# Patient Record
Sex: Female | Born: 1975 | Race: White | Hispanic: No | Marital: Married | Smoking: Former smoker
Health system: Southern US, Community
[De-identification: ages and names within clinical notes are randomized; demographics above are authoritative.]

## PROBLEM LIST (undated history)

## (undated) DIAGNOSIS — F419 Anxiety disorder, unspecified: Secondary | ICD-10-CM

## (undated) DIAGNOSIS — L719 Rosacea, unspecified: Secondary | ICD-10-CM

## (undated) DIAGNOSIS — F32A Depression, unspecified: Secondary | ICD-10-CM

## (undated) DIAGNOSIS — N809 Endometriosis, unspecified: Secondary | ICD-10-CM

## (undated) DIAGNOSIS — B009 Herpesviral infection, unspecified: Secondary | ICD-10-CM

## (undated) DIAGNOSIS — T7840XA Allergy, unspecified, initial encounter: Secondary | ICD-10-CM

## (undated) DIAGNOSIS — J45909 Unspecified asthma, uncomplicated: Secondary | ICD-10-CM

## (undated) DIAGNOSIS — F329 Major depressive disorder, single episode, unspecified: Secondary | ICD-10-CM

## (undated) HISTORY — PX: DILATION AND CURETTAGE OF UTERUS: SHX78

## (undated) HISTORY — PX: LAPAROSCOPY: SHX197

## (undated) HISTORY — DX: Herpesviral infection, unspecified: B00.9

## (undated) HISTORY — DX: Depression, unspecified: F32.A

## (undated) HISTORY — DX: Major depressive disorder, single episode, unspecified: F32.9

## (undated) HISTORY — DX: Rosacea, unspecified: L71.9

## (undated) HISTORY — DX: Allergy, unspecified, initial encounter: T78.40XA

## (undated) HISTORY — DX: Unspecified asthma, uncomplicated: J45.909

## (undated) HISTORY — DX: Endometriosis, unspecified: N80.9

## (undated) HISTORY — DX: Anxiety disorder, unspecified: F41.9

---

## 2006-08-27 ENCOUNTER — Encounter: Payer: Self-pay | Admitting: Unknown Physician Specialty

## 2006-08-30 ENCOUNTER — Encounter: Payer: Self-pay | Admitting: Unknown Physician Specialty

## 2010-09-30 ENCOUNTER — Ambulatory Visit: Payer: Self-pay | Admitting: Internal Medicine

## 2010-10-18 ENCOUNTER — Ambulatory Visit: Payer: Self-pay | Admitting: Internal Medicine

## 2010-10-30 ENCOUNTER — Ambulatory Visit: Payer: Self-pay | Admitting: Internal Medicine

## 2010-11-30 ENCOUNTER — Ambulatory Visit: Payer: Self-pay | Admitting: Internal Medicine

## 2010-12-30 ENCOUNTER — Ambulatory Visit: Payer: Self-pay | Admitting: Internal Medicine

## 2011-02-28 ENCOUNTER — Ambulatory Visit: Payer: Self-pay | Admitting: General Practice

## 2011-07-03 ENCOUNTER — Ambulatory Visit: Payer: Self-pay | Admitting: General Practice

## 2011-07-03 LAB — HCG, QUANTITATIVE, PREGNANCY: Beta Hcg, Quant.: <1 m[IU]/mL — ABNORMAL LOW

## 2011-08-04 ENCOUNTER — Emergency Department: Payer: Self-pay | Admitting: Emergency Medicine

## 2013-07-21 ENCOUNTER — Ambulatory Visit: Payer: Self-pay | Admitting: General Practice

## 2015-05-20 ENCOUNTER — Other Ambulatory Visit: Payer: Self-pay | Admitting: Physician Assistant

## 2015-09-12 ENCOUNTER — Encounter: Payer: Self-pay | Admitting: Family Medicine

## 2015-09-13 ENCOUNTER — Other Ambulatory Visit: Payer: Self-pay | Admitting: Obstetrics and Gynecology

## 2015-09-13 DIAGNOSIS — Z1231 Encounter for screening mammogram for malignant neoplasm of breast: Secondary | ICD-10-CM

## 2015-10-30 ENCOUNTER — Ambulatory Visit
Admission: RE | Admit: 2015-10-30 | Discharge: 2015-10-30 | Disposition: A | Discharge status: Home / Self Care | Payer: BC Managed Care – PPO | Source: Ambulatory Visit | Attending: Obstetrics and Gynecology | Admitting: Obstetrics and Gynecology

## 2015-10-30 DIAGNOSIS — Z1231 Encounter for screening mammogram for malignant neoplasm of breast: Secondary | ICD-10-CM

## 2015-10-30 DIAGNOSIS — N63 Unspecified lump in breast: Secondary | ICD-10-CM | POA: Insufficient documentation

## 2015-11-01 ENCOUNTER — Other Ambulatory Visit: Payer: Self-pay | Admitting: Obstetrics and Gynecology

## 2015-11-01 DIAGNOSIS — R928 Other abnormal and inconclusive findings on diagnostic imaging of breast: Secondary | ICD-10-CM

## 2015-11-02 ENCOUNTER — Ambulatory Visit
Admission: RE | Admit: 2015-11-02 | Discharge: 2015-11-02 | Disposition: A | Discharge status: Home / Self Care | Payer: BC Managed Care – PPO | Source: Ambulatory Visit | Attending: Obstetrics and Gynecology | Admitting: Obstetrics and Gynecology

## 2015-11-02 DIAGNOSIS — R928 Other abnormal and inconclusive findings on diagnostic imaging of breast: Secondary | ICD-10-CM | POA: Insufficient documentation

## 2016-09-12 ENCOUNTER — Other Ambulatory Visit: Payer: Self-pay | Admitting: Obstetrics and Gynecology

## 2016-09-12 DIAGNOSIS — Z1231 Encounter for screening mammogram for malignant neoplasm of breast: Secondary | ICD-10-CM

## 2016-09-12 LAB — HM PAP SMEAR

## 2016-09-12 LAB — HEPATIC FUNCTION PANEL
ALT: 12 (ref 7–35)
AST: 13 (ref 13–35)
Alkaline Phosphatase: 39 (ref 25–125)
BILIRUBIN, TOTAL: 0.5

## 2016-09-12 LAB — BASIC METABOLIC PANEL
BUN: 11 (ref 4–21)
CREATININE: 0.6 (ref 0.5–1.1)
Glucose: 84
Potassium: 4.3 (ref 3.4–5.3)
SODIUM: 139 (ref 137–147)

## 2016-09-12 LAB — CBC AND DIFFERENTIAL
HCT: 40 (ref 36–46)
HEMOGLOBIN: 13.7 (ref 12.0–16.0)
PLATELETS: 286 (ref 150–399)
WBC: 4.4

## 2016-09-12 LAB — VITAMIN D 25 HYDROXY (VIT D DEFICIENCY, FRACTURES): Vit D, 25-Hydroxy: 15.1

## 2016-09-12 LAB — TSH: TSH: 0.95 (ref 0.41–5.90)

## 2016-09-12 LAB — LIPID PANEL
CHOLESTEROL: 159 (ref 0–200)
HDL: 50 (ref 35–70)
LDL CALC: 98
TRIGLYCERIDES: 74 (ref 40–160)

## 2016-09-12 LAB — HEMOGLOBIN A1C: HEMOGLOBIN A1C: 5.3

## 2017-01-09 ENCOUNTER — Ambulatory Visit
Admission: RE | Admit: 2017-01-09 | Discharge: 2017-01-09 | Disposition: A | Discharge status: Home / Self Care | Payer: BC Managed Care – PPO | Source: Ambulatory Visit | Attending: Obstetrics and Gynecology | Admitting: Obstetrics and Gynecology

## 2017-01-09 DIAGNOSIS — Z1231 Encounter for screening mammogram for malignant neoplasm of breast: Secondary | ICD-10-CM | POA: Diagnosis not present

## 2017-09-17 ENCOUNTER — Other Ambulatory Visit: Payer: Self-pay | Admitting: Obstetrics and Gynecology

## 2017-09-17 DIAGNOSIS — Z1231 Encounter for screening mammogram for malignant neoplasm of breast: Secondary | ICD-10-CM

## 2017-09-17 LAB — LIPID PANEL
Cholesterol: 182 (ref 0–200)
HDL: 61 (ref 35–70)
LDL Cholesterol: 115
Triglycerides: 75 (ref 40–160)

## 2017-09-17 LAB — CBC AND DIFFERENTIAL
HEMATOCRIT: 43 (ref 36–46)
HEMOGLOBIN: 14.4 (ref 12.0–16.0)
Platelets: 309 (ref 150–399)
WBC: 4.2

## 2017-09-17 LAB — VITAMIN D 25 HYDROXY (VIT D DEFICIENCY, FRACTURES): VIT D 25 HYDROXY: 22.8

## 2017-09-17 LAB — BASIC METABOLIC PANEL
BUN: 11 (ref 4–21)
CREATININE: 0.6 (ref 0.5–1.1)
Glucose: 89
POTASSIUM: 4 (ref 3.4–5.3)
SODIUM: 140 (ref 137–147)

## 2017-09-17 LAB — HEPATIC FUNCTION PANEL
ALT: 16 (ref 7–35)
AST: 15 (ref 13–35)
Alkaline Phosphatase: 45 (ref 25–125)
Bilirubin, Total: 0.7

## 2017-09-17 LAB — HEMOGLOBIN A1C: HEMOGLOBIN A1C: 5.2

## 2017-09-17 LAB — HM PAP SMEAR

## 2017-09-17 LAB — TSH: TSH: 1.29 (ref 0.41–5.90)

## 2018-01-12 ENCOUNTER — Ambulatory Visit
Admission: RE | Admit: 2018-01-12 | Discharge: 2018-01-12 | Disposition: A | Discharge status: Home / Self Care | Payer: BC Managed Care – PPO | Source: Ambulatory Visit | Attending: Obstetrics and Gynecology | Admitting: Obstetrics and Gynecology

## 2018-01-12 DIAGNOSIS — Z1231 Encounter for screening mammogram for malignant neoplasm of breast: Secondary | ICD-10-CM | POA: Insufficient documentation

## 2018-08-10 ENCOUNTER — Encounter: Payer: Self-pay | Admitting: Family Medicine

## 2018-08-10 ENCOUNTER — Ambulatory Visit: Payer: BC Managed Care – PPO | Admitting: Family Medicine

## 2018-08-10 VITALS — BP 121/83 | HR 76 | Temp 98.3°F | Ht 62.0 in | Wt 197.0 lb

## 2018-08-10 DIAGNOSIS — F17200 Nicotine dependence, unspecified, uncomplicated: Secondary | ICD-10-CM | POA: Diagnosis not present

## 2018-08-10 DIAGNOSIS — F3341 Major depressive disorder, recurrent, in partial remission: Secondary | ICD-10-CM | POA: Insufficient documentation

## 2018-08-10 DIAGNOSIS — Z6836 Body mass index (BMI) 36.0-36.9, adult: Secondary | ICD-10-CM

## 2018-08-10 DIAGNOSIS — L719 Rosacea, unspecified: Secondary | ICD-10-CM

## 2018-08-10 DIAGNOSIS — Z833 Family history of diabetes mellitus: Secondary | ICD-10-CM

## 2018-08-10 DIAGNOSIS — F411 Generalized anxiety disorder: Secondary | ICD-10-CM | POA: Diagnosis not present

## 2018-08-10 DIAGNOSIS — E669 Obesity, unspecified: Secondary | ICD-10-CM | POA: Insufficient documentation

## 2018-08-10 DIAGNOSIS — B009 Herpesviral infection, unspecified: Secondary | ICD-10-CM | POA: Insufficient documentation

## 2018-08-10 MED ORDER — BUPROPION HCL ER (XL) 300 MG PO TB24: 300.0000 mg | ORAL_TABLET | Freq: Every day | ORAL | 3 refills | Status: DC

## 2018-08-10 NOTE — Assessment & Plan Note (Signed)
Followed by Gordan Payment metrogel

## 2018-08-10 NOTE — Assessment & Plan Note (Signed)
Well controlled Contracted for safety Continue Bupropion at current dose Continue therapy

## 2018-08-10 NOTE — Progress Notes (Signed)
Patient: Alisha Miller, Female    DOB: Mar 26, 1976, 43 y.o.   MRN: [id number] Visit Date: 08/10/2018  Today's Provider: Lavon Paganini, MD   Chief Complaint  Patient presents with  . Establish Care   Subjective:    I, Tiburcio Pea, CMA, am acting as a scribe for Lavon Paganini, MD.    New Patient: Alisha Miller is a 43 y.o. female who presents today to Establish Care. She had her annual physical at [facility].  She will continue to be seen for GYN care there, but wants to have a PCP as she gets older. She feels well. She reports exercising none. She reports she is sleeping fairly well.  She has h/o genital herpes with rare outbreaks.  She does keep Valtrex on-hand in cadse of outbreak.  Husband has more outbreaks than her.  Anxiety/depression:  Ongoing intermittently for years.  Well controlled and stable on Wellbutrin XL 300mg  daily.  She is seeing a therapist once monthly.  Deneis AVH, SI, HI.  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  USed albuterol prn.  No problems in many years.  Never had PFTs.  Family history of breast cancer, cervical cancer: Followed by GYN.  Getting annual pap smears and mammograms.  Never had any abnormal.  Has IUD.  Also has h/o endometriosis.  Fam h/o DM: Blood sugars are good - fastings <100.  Last A1c 5.2.  Worried about her risk.  Has been smoking on and off for many years.  Currently smoking 1 pack of cigarettes per week. Will quit next month.  roseacea - metrogel daily Derm [facility] at [facility] -----------------------------------------------------------------   Review of Systems  Constitutional: Negative.   HENT: Negative.   Eyes: Negative.   Respiratory: Positive for chest tightness.   Cardiovascular: Negative.   Gastrointestinal: Negative.   Endocrine: Negative.   Genitourinary: Negative.   Musculoskeletal: Positive for back pain and neck pain.  Skin: Negative.     Allergic/Immunologic: Positive for environmental allergies.  Neurological: Negative.   Hematological: Negative.   Psychiatric/Behavioral: Negative.     Social History      She  reports that she has been smoking cigarettes. She has never used smokeless tobacco. She reports current alcohol use of about 13.0 standard drinks of alcohol per week. She reports that she does not use drugs.       Social History   Socioeconomic History  . Marital status: Married    Spouse name: Not on file  . Number of children: Not on file  . Years of education: Not on file  . Highest education level: Not on file  Occupational History  . Not on file  Social Needs  . Financial resource strain: Not on file  . Food insecurity:    Worry: Not on file    Inability: Not on file  . Transportation needs:    Medical: Not on file    Non-medical: Not on file  Tobacco Use  . Smoking status: Light Tobacco Smoker    Types: Cigarettes  . Smokeless tobacco: Never Used  Substance and Sexual Activity  . Alcohol use: Yes    Alcohol/week: 13.0 standard drinks    Types: 7 Glasses of wine, 6 Cans of beer per week  . Drug use: Never  . Sexual activity: Yes    Birth control/protection: I.U.D.  Lifestyle  . Physical activity:    Days per week: Not on file  Minutes per session: Not on file  . Stress: Not on file  Relationships  . Social connections:    Talks on phone: Not on file    Gets together: Not on file    Attends religious service: Not on file    Active member of club or organization: Not on file    Attends meetings of clubs or organizations: Not on file    Relationship status: Not on file  Other Topics Concern  . Not on file  Social History Narrative  . Not on file    Past Medical History:  Diagnosis Date  . Allergy   . Anxiety   . Asthma   . Depression   . Endometriosis   . HSV (herpes simplex virus) infection   . Rosacea      There are no active problems to display for this  patient.   Past Surgical History:  Procedure Laterality Date  . DILATION AND CURETTAGE OF UTERUS    . LAPAROSCOPY      Family History        Family Status  Relation Name Status  . [name]  (Not Specified)  . [name]  Deceased  . [name]  Deceased  . Mother  Deceased  . Father  Alive  . [name]  (Not Specified)  . [name]  Deceased  . [name]  Deceased        Her family history includes Alcohol abuse in her paternal grandfather; Alzheimer's disease in her mother; Arthritis in her mother; Atrial fibrillation in her father; Breast cancer in her maternal grandmother and paternal aunt; Cancer in her maternal grandmother, paternal aunt, and paternal grandmother; Cervical cancer in her paternal grandmother; Dementia in her maternal grandmother; Diabetes in her father, maternal grandfather, mother, and paternal aunt; Heart attack in her maternal grandfather and paternal grandfather; Heart disease in her father and paternal uncle; Heart failure in her mother; Hyperlipidemia in her father; Hypertension in her father, maternal grandfather, and paternal uncle; Prostate cancer in her father.      Allergies  Allergen Reactions  . Erythromycin Other (See Comments)     Current Outpatient Medications:  .  buPROPion (ZYBAN) 150 MG 12 hr tablet, Take 300 mg by mouth daily., Disp: , Rfl:  .  fluticasone (FLONASE) 50 MCG/ACT nasal spray, Place 2 sprays into both nostrils daily., Disp: , Rfl:  .  levonorgestrel (MIRENA) 20 MCG/24HR IUD, by Intrauterine route., Disp: , Rfl:  .  metroNIDAZOLE (METROGEL) 1 % gel, Apply topically daily., Disp: , Rfl:  .  SULFACLEANSE 8/4 8-4 % SUSP, , Disp: , Rfl:  .  valACYclovir (VALTREX) 500 MG tablet, , Disp: , Rfl:  .  Vitamin D, Ergocalciferol, (DRISDOL) 1.25 MG (50000 UT) CAPS capsule, , Disp: , Rfl:    Patient Care Team: Virginia Crews, MD as PCP - General (Family Medicine)    Objective:    Vitals: BP 121/83 (BP Location: Right Arm, Patient Position: Sitting,  Cuff Size: Normal)   Pulse 76   Temp 98.3 F (36.8 C) (Oral)   Ht 5\' 2"  (1.575 m)   Wt 197 lb (89.4 kg)   SpO2 99%   BMI 36.03 kg/m    Vitals:   08/10/18 0921  BP: 121/83  Pulse: 76  Temp: 98.3 F (36.8 C)  TempSrc: Oral  SpO2: 99%  Weight: 197 lb (89.4 kg)  Height: 5\' 2"  (1.575 m)     Physical Exam Vitals signs reviewed.  Constitutional:  General: She is not in acute distress.    Appearance: Normal appearance. She is well-developed. She is not diaphoretic.  HENT:     Head: Normocephalic and atraumatic.     Right Ear: Tympanic membrane, ear canal and external ear normal.     Left Ear: Tympanic membrane, ear canal and external ear normal.     Nose: Nose normal.     Mouth/Throat:     Mouth: Mucous membranes are moist.     Pharynx: Oropharynx is clear. No oropharyngeal exudate.  Eyes:     General: No scleral icterus.    Conjunctiva/sclera: Conjunctivae normal.     Pupils: Pupils are equal, round, and reactive to light.  Neck:     Musculoskeletal: Neck supple.     Thyroid: No thyromegaly.  Cardiovascular:     Rate and Rhythm: Normal rate and regular rhythm.     Pulses: Normal pulses.     Heart sounds: Normal heart sounds. No murmur.  Pulmonary:     Effort: Pulmonary effort is normal. No respiratory distress.     Breath sounds: Normal breath sounds. No wheezing or rales.  Abdominal:     General: Bowel sounds are normal. There is no distension.     Palpations: Abdomen is soft.     Tenderness: There is no abdominal tenderness. There is no guarding or rebound.  Musculoskeletal:        General: No deformity.     Right lower leg: No edema.     Left lower leg: No edema.  Lymphadenopathy:     Cervical: No cervical adenopathy.  Skin:    General: Skin is warm and dry.     Capillary Refill: Capillary refill takes less than 2 seconds.     Findings: No rash.  Neurological:     General: No focal deficit present.     Mental Status: She is alert and oriented to  person, place, and time. Mental status is at baseline.  Psychiatric:        Mood and Affect: Mood normal.        Behavior: Behavior normal.        Thought Content: Thought content normal.      Depression Screen PHQ 2/9 Scores 08/10/2018  PHQ - 2 Score 2  PHQ- 9 Score 11      Assessment & Plan:     Establish Care  Exercise Activities and Dietary recommendations Goals   None      There is no immunization history on file for this patient.  Health Maintenance  Topic Date Due  . HIV Screening  09/18/1990  . TETANUS/TDAP  09/18/1994  . PAP SMEAR-Modifier  09/17/1996  . INFLUENZA VACCINE  09/29/2018 (Originally 01/29/2018)     Discussed health benefits of physical activity, and encouraged her to engage in regular exercise appropriate for her age and condition.   ROI sent to [facility] for mammograms Reviewed last labs and pap smear result. --------------------------------------------------------------------  Problem List Items Addressed This Visit      Musculoskeletal and Integument   Rosacea    Followed by Payton Mccallum Continue metrogel        Other   Family history of diabetes mellitus    Will continue to monitor annual A1c Reviewed last A1c Discussed low carb diet and exercise      Recurrent major depressive disorder, in partial remission ([place]) - Primary    Well controlled Contracted for safety Continue Bupropion at current dose Continue therapy  Relevant Medications   buPROPion (WELLBUTRIN XL) 300 MG 24 hr tablet   GAD (generalized anxiety disorder)    Well controlled Continue Bupropion at current dose Continue therapy      Relevant Medications   buPROPion (WELLBUTRIN XL) 300 MG 24 hr tablet   Tobacco use disorder    Discussed improtance of cessation and health risks of continued smoking 3-5 minute discussion      HSV (herpes simplex virus) infection    Continue valtrex prn at first sign of outbreak      Relevant Medications    valACYclovir (VALTREX) 500 MG tablet   Obesity    Discussed importance of healthy weight management, diet, exercise          Return in about 6 months (around 02/08/2019) for CPE.  Approximately 45 minutes was spent in discussion of which greater than 50% was consultation.    The entirety of the information documented in the History of Present Illness, Review of Systems and Physical Exam were personally obtained by me. Portions of this information were initially documented by Tiburcio Pea, CMA and reviewed by me for thoroughness and accuracy.    Virginia Crews, MD, MPH [facility] 08/10/2018 11:59 AM

## 2018-08-10 NOTE — Assessment & Plan Note (Signed)
Continue valtrex prn at first sign of outbreak

## 2018-08-10 NOTE — Assessment & Plan Note (Signed)
Discussed importance of healthy weight management, diet, exercise

## 2018-08-10 NOTE — Assessment & Plan Note (Signed)
Discussed improtance of cessation and health risks of continued smoking 3-5 minute discussion

## 2018-08-10 NOTE — Patient Instructions (Signed)
Preventive Care 40-64 Years, Female Preventive care refers to lifestyle choices and visits with your health care provider that can promote health and wellness. What does preventive care include?   A yearly physical exam. This is also called an annual well check.  Dental exams once or twice a year.  Routine eye exams. Ask your health care provider how often you should have your eyes checked.  Personal lifestyle choices, including: ? Daily care of your teeth and gums. ? Regular physical activity. ? Eating a healthy diet. ? Avoiding tobacco and drug use. ? Limiting alcohol use. ? Practicing safe sex. ? Taking low-dose aspirin daily starting at age 50. ? Taking vitamin and mineral supplements as recommended by your health care provider. What happens during an annual well check? The services and screenings done by your health care provider during your annual well check will depend on your age, overall health, lifestyle risk factors, and family history of disease. Counseling Your health care provider may ask you questions about your:  Alcohol use.  Tobacco use.  Drug use.  Emotional well-being.  Home and relationship well-being.  Sexual activity.  Eating habits.  Work and work environment.  Method of birth control.  Menstrual cycle.  Pregnancy history. Screening You may have the following tests or measurements:  Height, weight, and BMI.  Blood pressure.  Lipid and cholesterol levels. These may be checked every 5 years, or more frequently if you are over 50 years old.  Skin check.  Lung cancer screening. You may have this screening every year starting at age 55 if you have a 30-pack-year history of smoking and currently smoke or have quit within the past 15 years.  Colorectal cancer screening. All adults should have this screening starting at age 50 and continuing until age 75. Your health care provider may recommend screening at age 45. You will have tests every  1-10 years, depending on your results and the type of screening test. People at increased risk should start screening at an earlier age. Screening tests may include: ? Guaiac-based fecal occult blood testing. ? Fecal immunochemical test (FIT). ? Stool DNA test. ? Virtual colonoscopy. ? Sigmoidoscopy. During this test, a flexible tube with a tiny camera (sigmoidoscope) is used to examine your rectum and lower colon. The sigmoidoscope is inserted through your anus into your rectum and lower colon. ? Colonoscopy. During this test, a long, thin, flexible tube with a tiny camera (colonoscope) is used to examine your entire colon and rectum.  Hepatitis C blood test.  Hepatitis B blood test.  Sexually transmitted disease (STD) testing.  Diabetes screening. This is done by checking your blood sugar (glucose) after you have not eaten for a while (fasting). You may have this done every 1-3 years.  Mammogram. This may be done every 1-2 years. Talk to your health care provider about when you should start having regular mammograms. This may depend on whether you have a family history of breast cancer.  BRCA-related cancer screening. This may be done if you have a family history of breast, ovarian, tubal, or peritoneal cancers.  Pelvic exam and Pap test. This may be done every 3 years starting at age 21. Starting at age 30, this may be done every 5 years if you have a Pap test in combination with an HPV test.  Bone density scan. This is done to screen for osteoporosis. You may have this scan if you are at high risk for osteoporosis. Discuss your test results, treatment options,   and if necessary, the need for more tests with your health care provider. Vaccines Your health care provider may recommend certain vaccines, such as:  Influenza vaccine. This is recommended every year.  Tetanus, diphtheria, and acellular pertussis (Tdap, Td) vaccine. You may need a Td booster every 10 years.  Varicella  vaccine. You may need this if you have not been vaccinated.  Zoster vaccine. You may need this after age 38.  Measles, mumps, and rubella (MMR) vaccine. You may need at least one dose of MMR if you were born in 1957 or later. You may also need a second dose.  Pneumococcal 13-valent conjugate (PCV13) vaccine. You may need this if you have certain conditions and were not previously vaccinated.  Pneumococcal polysaccharide (PPSV23) vaccine. You may need one or two doses if you smoke cigarettes or if you have certain conditions.  Meningococcal vaccine. You may need this if you have certain conditions.  Hepatitis A vaccine. You may need this if you have certain conditions or if you travel or work in places where you may be exposed to hepatitis A.  Hepatitis B vaccine. You may need this if you have certain conditions or if you travel or work in places where you may be exposed to hepatitis B.  Haemophilus influenzae type b (Hib) vaccine. You may need this if you have certain conditions. Talk to your health care provider about which screenings and vaccines you need and how often you need them. This information is not intended to replace advice given to you by your health care provider. Make sure you discuss any questions you have with your health care provider. Document Released: 07/14/2015 Document Revised: 08/07/2017 Document Reviewed: 04/18/2015 Elsevier Interactive Patient Education  2019 [employer].

## 2018-08-10 NOTE — Assessment & Plan Note (Signed)
Will continue to monitor annual A1c Reviewed last A1c Discussed low carb diet and exercise

## 2018-08-10 NOTE — Assessment & Plan Note (Signed)
Well controlled Continue Bupropion at current dose Continue therapy

## 2018-10-01 LAB — HEPATIC FUNCTION PANEL
ALT: 31 (ref 7–35)
AST: 22 (ref 13–35)
Alkaline Phosphatase: 54 (ref 25–125)
Bilirubin, Total: 0.3

## 2018-10-01 LAB — CBC AND DIFFERENTIAL
HCT: 39 (ref 36–46)
Hemoglobin: 12.8 (ref 12.0–16.0)
Platelets: 303 (ref 150–399)
WBC: 5.1

## 2018-10-01 LAB — HEMOGLOBIN A1C: Hemoglobin A1C: 5.2

## 2018-10-01 LAB — VITAMIN D 25 HYDROXY (VIT D DEFICIENCY, FRACTURES): Vit D, 25-Hydroxy: 51.6

## 2018-10-01 LAB — TSH: TSH: 1.27 (ref 0.41–5.90)

## 2018-10-01 LAB — BASIC METABOLIC PANEL
BUN: 10 (ref 4–21)
Creatinine: 0.8 (ref 0.5–1.1)
Glucose: 92
Potassium: 4.3 (ref 3.4–5.3)
Sodium: 139 (ref 137–147)

## 2018-10-02 LAB — LIPID PANEL
Cholesterol: 173 (ref 0–200)
HDL: 50 (ref 35–70)
LDL Cholesterol: 104
Triglycerides: 95 (ref 40–160)

## 2018-10-07 LAB — HM PAP SMEAR

## 2018-10-14 ENCOUNTER — Other Ambulatory Visit: Payer: Self-pay | Admitting: Obstetrics and Gynecology

## 2018-10-14 DIAGNOSIS — Z1231 Encounter for screening mammogram for malignant neoplasm of breast: Secondary | ICD-10-CM

## 2019-02-02 ENCOUNTER — Ambulatory Visit (INDEPENDENT_AMBULATORY_CARE_PROVIDER_SITE_OTHER): Payer: BC Managed Care – PPO | Admitting: Family Medicine

## 2019-02-02 ENCOUNTER — Other Ambulatory Visit: Payer: Self-pay

## 2019-02-02 ENCOUNTER — Encounter: Payer: Self-pay | Admitting: Family Medicine

## 2019-02-02 VITALS — BP 127/85 | HR 76 | Temp 98.7°F | Wt 195.4 lb

## 2019-02-02 DIAGNOSIS — Z6835 Body mass index (BMI) 35.0-35.9, adult: Secondary | ICD-10-CM

## 2019-02-02 DIAGNOSIS — E669 Obesity, unspecified: Secondary | ICD-10-CM

## 2019-02-02 DIAGNOSIS — F3341 Major depressive disorder, recurrent, in partial remission: Secondary | ICD-10-CM | POA: Diagnosis not present

## 2019-02-02 DIAGNOSIS — Z833 Family history of diabetes mellitus: Secondary | ICD-10-CM | POA: Diagnosis not present

## 2019-02-02 DIAGNOSIS — Z0000 Encounter for general adult medical examination without abnormal findings: Secondary | ICD-10-CM | POA: Diagnosis not present

## 2019-02-02 DIAGNOSIS — F411 Generalized anxiety disorder: Secondary | ICD-10-CM

## 2019-02-02 NOTE — Assessment & Plan Note (Signed)
Well-controlled Continue bupropion at current dose Continue therapy

## 2019-02-02 NOTE — Assessment & Plan Note (Signed)
Discussed importance of healthy weight management Discussed diet and exercise  

## 2019-02-02 NOTE — Patient Instructions (Signed)

## 2019-02-02 NOTE — Progress Notes (Signed)
Patient: Alisha Miller, Female    DOB: [birth year], 42 y.o.   MRN: [id number] Visit Date: 02/02/2019  Today's Provider: Lavon Paganini, MD   Chief Complaint  Patient presents with  . Annual Exam   Subjective:    I, Alisha Miller CMA, am acting as a scribe for Lavon Paganini, MD.    Annual physical exam Alisha Miller is a 43 y.o. female who presents today for health maintenance and complete physical. She feels well. She reports exercising none. She reports she is sleeping well.  ----------------------------------------------------------------- Last Pap:09/17/2017 - states that this was repeated in 2020 and was told that it was normal Last mammogram:01/12/2018 ( Pt has a pending order)  More stress at work recently.  Feels like she is struggling more with depression. Seems to happen each summer. Body image and poor self confidence contributes. Still seeing therapist and doing well onWellbutrin  Quit smoking in March.  Review of Systems  Constitutional: Negative.   HENT: Negative.   Eyes: Negative.   Respiratory: Negative.   Cardiovascular: Negative.   Gastrointestinal: Negative.   Endocrine: Negative.   Genitourinary: Negative.   Musculoskeletal: Positive for back pain, neck pain and neck stiffness.  Allergic/Immunologic: Positive for environmental allergies.  Neurological: Negative.   Hematological: Negative.   Psychiatric/Behavioral: Negative.     Social History      She  reports that she has been smoking cigarettes. She has never used smokeless tobacco. She reports current alcohol use of about 13.0 standard drinks of alcohol per week. She reports that she does not use drugs.       Social History   Socioeconomic History  . Marital status: Married    Spouse name: Not on file  . Number of children: 0  . Years of education: Not on file  . Highest education level: Not on file  Occupational History  . Occupation: [employer]   Social Needs  . Financial resource strain: Not on file  . Food insecurity    Worry: Not on file    Inability: Not on file  . Transportation needs    Medical: Not on file    Non-medical: Not on file  Tobacco Use  . Smoking status: Light Tobacco Smoker    Types: Cigarettes  . Smokeless tobacco: Never Used  Substance and Sexual Activity  . Alcohol use: Yes    Alcohol/week: 13.0 standard drinks    Types: 7 Glasses of wine, 6 Cans of beer per week  . Drug use: Never  . Sexual activity: Yes    Birth control/protection: I.U.D.  Lifestyle  . Physical activity    Days per week: Not on file    Minutes per session: Not on file  . Stress: Not on file  Relationships  . Social Herbalist on phone: Not on file    Gets together: Not on file    Attends religious service: Not on file    Active member of club or organization: Not on file    Attends meetings of clubs or organizations: Not on file    Relationship status: Not on file  Other Topics Concern  . Not on file  Social History Narrative  . Not on file    Past Medical History:  Diagnosis Date  . Allergy   . Anxiety   . Asthma   . Depression   . Endometriosis   . HSV (herpes simplex virus) infection   .  Rosacea      Patient Active Problem List   Diagnosis Date Noted  . Family history of diabetes mellitus 08/10/2018  . Recurrent major depressive disorder, in partial remission ([place]) 08/10/2018  . GAD (generalized anxiety disorder) 08/10/2018  . Rosacea 08/10/2018  . HSV (herpes simplex virus) infection 08/10/2018  . Obesity 08/10/2018    Past Surgical History:  Procedure Laterality Date  . DILATION AND CURETTAGE OF UTERUS    . LAPAROSCOPY      Family History        Family Status  Relation Name Status  . [name]  (Not Specified)  . [name]  Deceased  . [name]  Deceased  . Mother  Deceased  . Father  Alive  . [name]  (Not Specified)  . [name]  Deceased  . [name]  Deceased        Her family history  includes Alcohol abuse in her paternal grandfather; Alzheimer's disease in her mother; Arthritis in her mother; Atrial fibrillation in her father; Breast cancer in her maternal grandmother and paternal aunt; Cancer in her paternal aunt and paternal grandmother; Cervical cancer in her paternal grandmother; Dementia in her maternal grandmother; Diabetes in her father, maternal grandfather, mother, and paternal aunt; Heart attack in her maternal grandfather and paternal grandfather; Heart disease in her father and paternal uncle; Heart failure in her mother; Hyperlipidemia in her father; Hypertension in her father, maternal grandfather, and paternal uncle; Prostate cancer in her father.      Allergies  Allergen Reactions  . Erythromycin Other (See Comments)     Current Outpatient Medications:  .  buPROPion (WELLBUTRIN XL) 300 MG 24 hr tablet, Take 1 tablet (300 mg total) by mouth daily., Disp: 90 tablet, Rfl: 3 .  doxycycline (VIBRAMYCIN) 50 MG capsule, , Disp: , Rfl:  .  fluticasone (FLONASE) 50 MCG/ACT nasal spray, Place 2 sprays into both nostrils daily., Disp: , Rfl:  .  levonorgestrel (MIRENA) 20 MCG/24HR IUD, by Intrauterine route., Disp: , Rfl:  .  loratadine (CLARITIN) 10 MG tablet, Take 10 mg by mouth daily., Disp: , Rfl:  .  metroNIDAZOLE (METROGEL) 1 % gel, Apply topically daily., Disp: , Rfl:  .  SULFACLEANSE 8/4 8-4 % SUSP, , Disp: , Rfl:  .  valACYclovir (VALTREX) 500 MG tablet, , Disp: , Rfl:    Patient Care Team: Virginia Crews, MD as PCP - General (Family Medicine)    Objective:    Vitals: BP 127/85 (BP Location: Left Arm, Patient Position: Sitting, Cuff Size: Normal)   Pulse 76   Temp 98.7 F (37.1 C) (Oral)   Wt 195 lb 6.4 oz (88.6 kg)   SpO2 96%   BMI 35.74 kg/m    Vitals:   02/02/19 1002  BP: 127/85  Pulse: 76  Temp: 98.7 F (37.1 C)  TempSrc: Oral  SpO2: 96%  Weight: 195 lb 6.4 oz (88.6 kg)     Physical Exam Vitals signs reviewed.   Constitutional:      General: She is not in acute distress.    Appearance: Normal appearance. She is well-developed. She is not diaphoretic.  HENT:     Head: Normocephalic and atraumatic.     Right Ear: Tympanic membrane, ear canal and external ear normal.     Left Ear: Tympanic membrane, ear canal and external ear normal.  Eyes:     General: No scleral icterus.    Conjunctiva/sclera: Conjunctivae normal.     Pupils: Pupils are equal, round, and  reactive to light.  Neck:     Musculoskeletal: Neck supple.     Thyroid: No thyromegaly.  Cardiovascular:     Rate and Rhythm: Normal rate and regular rhythm.     Pulses: Normal pulses.     Heart sounds: Normal heart sounds. No murmur.  Pulmonary:     Effort: Pulmonary effort is normal. No respiratory distress.     Breath sounds: Normal breath sounds. No wheezing or rales.  Abdominal:     General: There is no distension.     Palpations: Abdomen is soft.     Tenderness: There is no abdominal tenderness.  Musculoskeletal:        General: No deformity.     Right lower leg: No edema.     Left lower leg: No edema.  Lymphadenopathy:     Cervical: No cervical adenopathy.  Skin:    General: Skin is warm and dry.     Capillary Refill: Capillary refill takes less than 2 seconds.     Findings: No rash.  Neurological:     Mental Status: She is alert and oriented to person, place, and time. Mental status is at baseline.  Psychiatric:        Mood and Affect: Mood normal.        Behavior: Behavior normal.        Thought Content: Thought content normal.      Depression Screen PHQ 2/9 Scores 02/02/2019 08/10/2018  PHQ - 2 Score 1 2  PHQ- 9 Score 4 11       Assessment & Plan:     Routine Health Maintenance and Physical Exam  Exercise Activities and Dietary recommendations Goals   None     Immunization History  Administered Date(s) Administered  . Td 03/22/1998  . Tdap 09/27/2010    Health Maintenance  Topic Date Due  . HIV  Screening  09/18/1990  . MAMMOGRAM  01/13/2019  . INFLUENZA VACCINE  01/30/2019  . PAP SMEAR-Modifier  09/17/2020  . TETANUS/TDAP  09/26/2020     Discussed health benefits of physical activity, and encouraged her to engage in regular exercise appropriate for her age and condition.    Congratulated patient on quitting smoking Reviewed labs from 09/2018 ROI sent to GYN for 2020 Pap smear Plan to recheck screening labs at physical next year Advised to get a flu shot in the fall --------------------------------------------------------------------  Problem List Items Addressed This Visit      Other   Family history of diabetes mellitus   Recurrent major depressive disorder, in partial remission ([place])    Well-controlled Contracted for safety Continue bupropion at current dose Continue therapy      GAD (generalized anxiety disorder)    Well-controlled Continue bupropion at current dose Continue therapy      Obesity    Discussed importance of healthy weight management Discussed diet and exercise        Other Visit Diagnoses    Encounter for annual physical exam    -  Primary       Return in about 1 year (around 02/02/2020) for CPE.   The entirety of the information documented in the History of Present Illness, Review of Systems and Physical Exam were personally obtained by me. Portions of this information were initially documented by [facility], CMA and reviewed by me for thoroughness and accuracy.    [place], Dionne Bucy, MD MPH [facility]

## 2019-02-02 NOTE — Assessment & Plan Note (Signed)
Well-controlled Contracted for safety Continue bupropion at current dose Continue therapy

## 2019-03-15 ENCOUNTER — Ambulatory Visit
Admission: RE | Admit: 2019-03-15 | Discharge: 2019-03-15 | Disposition: A | Discharge status: Home / Self Care | Payer: BC Managed Care – PPO | Source: Ambulatory Visit | Attending: Obstetrics and Gynecology | Admitting: Obstetrics and Gynecology

## 2019-03-15 ENCOUNTER — Other Ambulatory Visit: Payer: Self-pay

## 2019-03-15 ENCOUNTER — Encounter: Payer: Self-pay | Admitting: Family Medicine

## 2019-03-15 DIAGNOSIS — Z1231 Encounter for screening mammogram for malignant neoplasm of breast: Secondary | ICD-10-CM | POA: Insufficient documentation

## 2019-03-16 ENCOUNTER — Other Ambulatory Visit: Payer: Self-pay | Admitting: Obstetrics and Gynecology

## 2019-03-16 ENCOUNTER — Other Ambulatory Visit: Payer: Self-pay | Admitting: Family Medicine

## 2019-03-16 ENCOUNTER — Encounter: Payer: Self-pay | Admitting: Family Medicine

## 2019-03-16 DIAGNOSIS — Z1239 Encounter for other screening for malignant neoplasm of breast: Secondary | ICD-10-CM

## 2019-03-16 DIAGNOSIS — R2231 Localized swelling, mass and lump, right upper limb: Secondary | ICD-10-CM

## 2019-03-16 DIAGNOSIS — N6310 Unspecified lump in the right breast, unspecified quadrant: Secondary | ICD-10-CM

## 2019-03-23 ENCOUNTER — Ambulatory Visit
Admission: RE | Admit: 2019-03-23 | Discharge: 2019-03-23 | Disposition: A | Discharge status: Home / Self Care | Payer: BC Managed Care – PPO | Source: Ambulatory Visit | Attending: Family Medicine | Admitting: Family Medicine

## 2019-03-23 ENCOUNTER — Other Ambulatory Visit: Payer: Self-pay | Admitting: Family Medicine

## 2019-03-23 DIAGNOSIS — R2231 Localized swelling, mass and lump, right upper limb: Secondary | ICD-10-CM | POA: Diagnosis present

## 2019-03-23 DIAGNOSIS — N6310 Unspecified lump in the right breast, unspecified quadrant: Secondary | ICD-10-CM | POA: Diagnosis present

## 2019-03-23 DIAGNOSIS — Z1239 Encounter for other screening for malignant neoplasm of breast: Secondary | ICD-10-CM

## 2019-03-24 ENCOUNTER — Telehealth: Payer: Self-pay

## 2019-03-24 NOTE — Telephone Encounter (Signed)
LMTCB

## 2019-03-24 NOTE — Telephone Encounter (Signed)
-----   Message from Virginia Crews, MD sent at 03/24/2019  9:55 AM EDT ----- Normal mammogram and ultrasound. Repeat mammogram in 1 yr

## 2019-08-25 ENCOUNTER — Other Ambulatory Visit: Payer: Self-pay | Admitting: Family Medicine

## 2019-08-25 NOTE — Telephone Encounter (Signed)
Requested medication (s) are due for refill today:   Yes  Requested medication (s) are on the active medication list:   Yes  Future visit scheduled:   Yes   Last ordered: 08/10/2018  #90  3 refills  Clinic note:  Does not have a valid encounter within the last 6 months.   Requested Prescriptions  Pending Prescriptions Disp Refills   buPROPion (WELLBUTRIN XL) 300 MG 24 hr tablet [Pharmacy Med Name: BUPROPION HCL XL 300 MG TABLET] 90 tablet 3    Sig: TAKE 1 TABLET BY MOUTH EVERY DAY      Psychiatry: Antidepressants - bupropion Failed - 08/25/2019  8:17 AM      Failed - Valid encounter within last 6 months    Recent Outpatient Visits           6 months ago Encounter for annual physical exam   [facility], Dionne Bucy, MD   1 year ago Recurrent major depressive disorder, in partial remission [facility])   [facility], Dionne Bucy, MD              Passed - Completed PHQ-2 or PHQ-9 in the last 360 days.      Passed - Last BP in normal range    BP Readings from Last 1 Encounters:  02/02/19 127/85

## 2020-01-04 ENCOUNTER — Telehealth: Payer: Self-pay

## 2020-01-04 NOTE — Telephone Encounter (Signed)
Copied from [place] [id number]. Topic: General - Other >> Jan 04, 2020 10:43 AM Alanda Slim E wrote: Reason for CRM: Pt called to ask if her copay will be different since she will be seeing the PA for her cpe and not her pcp Dr. Jacinto Reap please call and advise

## 2020-01-31 NOTE — Progress Notes (Signed)
[name],acting as a Education administrator for [name], PA-C.,have documented all relevant documentation on the behalf of [name], PA-C,as directed by  [name], PA-C while in the presence of [name], PA-C.  Complete physical exam   Patient: Alisha Miller   DOB: 1975/09/13   44 y.o. Female  MRN: [id number] Visit Date: 02/02/2020  Today's healthcare provider: [name], PA-C   Chief Complaint  Patient presents with  . Annual Exam   Subjective    Alisha Miller is a 44 y.o. female who presents today for a complete physical exam.  She reports consuming a low carb diet. Home exercise routine includes walking and weight training. She generally feels well. She reports sleeping poorly. She does not have additional problems to discuss today.   HPI   Last pap: 10/07/2018 Last mammogram: 03/23/19  Patient reports she hasn't felt her IUD strings recently and would like them checked.   She continues with wellbutrin for depression/anxiety and feels this works well. Reports mood is well controlled.  Past Medical History:  Diagnosis Date  . Allergy   . Anxiety   . Asthma   . Depression   . Endometriosis   . HSV (herpes simplex virus) infection   . Rosacea    Past Surgical History:  Procedure Laterality Date  . DILATION AND CURETTAGE OF UTERUS    . LAPAROSCOPY     Social History   Socioeconomic History  . Marital status: Married    Spouse name: Not on file  . Number of children: 0  . Years of education: Not on file  . Highest education level: Not on file  Occupational History  . Occupation: [employer]  Tobacco Use  . Smoking status: Former Smoker    Types: Cigarettes    Quit date: 08/30/2018    Years since quitting: 1.4  . Smokeless tobacco: Never Used  Vaping Use  . Vaping Use: Never used  Substance and Sexual Activity  . Alcohol use: Yes    Alcohol/week: 13.0 standard drinks    Types: 7 Glasses of  wine, 6 Cans of beer per week  . Drug use: Never  . Sexual activity: Yes    Partners: Male    Birth control/protection: I.U.D.  Other Topics Concern  . Not on file  Social History Narrative  . Not on file   Social Determinants of Health   Financial Resource Strain:   . Difficulty of Paying Living Expenses:   Food Insecurity:   . Worried About Charity fundraiser in the Last Year:   . Arboriculturist in the Last Year:   Transportation Needs:   . Film/video editor (Medical):   Marland Kitchen Lack of Transportation (Non-Medical):   Physical Activity:   . Days of Exercise per Week:   . Minutes of Exercise per Session:   Stress:   . Feeling of Stress :   Social Connections:   . Frequency of Communication with Friends and Family:   . Frequency of Social Gatherings with Friends and Family:   . Attends Religious Services:   . Active Member of Clubs or Organizations:   . Attends Archivist Meetings:   Marland Kitchen Marital Status:   Intimate Partner Violence:   . Fear of Current or Ex-Partner:   . Emotionally Abused:   Marland Kitchen Physically Abused:   . Sexually Abused:    Family Status  Relation Name Status  .  [name]  (Not Specified)  . [name]  Deceased  . [name]  Deceased  . Mother  Deceased  . Father  Alive  . [name]  (Not Specified)  . [name]  Deceased  . [name]  Deceased   Family History  Problem Relation Age of Onset  . Breast cancer Paternal Aunt   . Cancer Paternal Aunt   . Diabetes Paternal Aunt   . Breast cancer Maternal Grandmother   . Dementia Maternal Grandmother   . Cervical cancer Paternal Grandmother   . Cancer Paternal Grandmother   . Alzheimer's disease Mother   . Diabetes Mother   . Heart failure Mother   . Arthritis Mother   . Diabetes Father   . Hypertension Father   . Hyperlipidemia Father   . Atrial fibrillation Father   . Heart disease Father   . Prostate cancer Father   . Heart disease Paternal Uncle   . Hypertension Paternal Uncle   . Heart attack Maternal  Grandfather   . Diabetes Maternal Grandfather   . Hypertension Maternal Grandfather   . Heart attack Paternal Grandfather   . Alcohol abuse Paternal Grandfather    Allergies  Allergen Reactions  . Erythromycin Other (See Comments)    Patient Care Team: Virginia Crews, MD as PCP - General (Family Medicine)   Medications: Outpatient Medications Prior to Visit  Medication Sig  . buPROPion (WELLBUTRIN XL) 300 MG 24 hr tablet TAKE 1 TABLET BY MOUTH EVERY DAY  . doxycycline (VIBRAMYCIN) 50 MG capsule   . fluticasone (FLONASE) 50 MCG/ACT nasal spray Place 2 sprays into both nostrils daily.  Marland Kitchen levonorgestrel (MIRENA) 20 MCG/24HR IUD by Intrauterine route.  . loratadine (CLARITIN) 10 MG tablet Take 10 mg by mouth daily.  . SULFACLEANSE 8/4 8-4 % SUSP   . valACYclovir (VALTREX) 500 MG tablet   . metroNIDAZOLE (METROGEL) 1 % gel Apply topically daily.   No facility-administered medications prior to visit.    Review of Systems  Constitutional: Positive for activity change.  HENT: Negative.   Eyes: Positive for itching.  Respiratory: Negative.   Cardiovascular: Negative.   Gastrointestinal: Negative.   Endocrine: Negative.   Genitourinary: Negative.   Musculoskeletal: Negative.   Skin: Negative.   Allergic/Immunologic: Negative.   Neurological: Negative.   Hematological: Negative.   Psychiatric/Behavioral: Negative.       Objective    BP 117/83 (BP Location: Right Arm, Patient Position: Sitting, Cuff Size: Normal)   Pulse 93   Temp 98.2 F (36.8 C) (Temporal)   Wt 169 lb 3.2 oz (76.7 kg)   BMI 30.95 kg/m    Physical Exam Vitals reviewed.  Constitutional:      Appearance: Normal appearance.  HENT:     Head: Normocephalic and atraumatic.     Right Ear: Tympanic membrane normal.     Left Ear: Tympanic membrane normal.     Mouth/Throat:     Mouth: Mucous membranes are moist.     Pharynx: Oropharynx is clear.  Eyes:     General: No scleral icterus.     Conjunctiva/sclera: Conjunctivae normal.  Neck:     Vascular: No carotid bruit.  Cardiovascular:     Rate and Rhythm: Normal rate and regular rhythm.     Pulses: Normal pulses.     Heart sounds: Normal heart sounds.  Pulmonary:     Effort: Pulmonary effort is normal.     Breath sounds: Normal breath sounds.  Abdominal:     General: Bowel sounds  are normal.     Palpations: Abdomen is soft.  Genitourinary:    Comments: IUD strings not visualized and not able to be swept from cervical os. Musculoskeletal:     Cervical back: Normal range of motion and neck supple.     Right lower leg: No edema.     Left lower leg: No edema.  Skin:    General: Skin is warm and dry.  Neurological:     General: No focal deficit present.     Mental Status: She is alert and oriented to person, place, and time.  Psychiatric:        Mood and Affect: Mood normal.        Behavior: Behavior normal.        Thought Content: Thought content normal.        Judgment: Judgment normal.       Last depression screening scores PHQ 2/9 Scores 02/02/2019 08/10/2018  PHQ - 2 Score 1 2  PHQ- 9 Score 4 11   Last fall risk screening Fall Risk  02/02/2019  Falls in the past year? 1  Number falls in past yr: 1  Injury with Fall? 1   Last Audit-C alcohol use screening Alcohol Use Disorder Test (AUDIT) 02/02/2019  1. How often do you have a drink containing alcohol? 3  2. How many drinks containing alcohol do you have on a typical day when you are drinking? 0  3. How often do you have six or more drinks on one occasion? 2  AUDIT-C Score 5   A score of 3 or more in women, and 4 or more in men indicates increased risk for alcohol abuse, EXCEPT if all of the points are from question 1   No results found for any visits on 02/02/20.  Assessment & Plan    Routine Health Maintenance and Physical Exam  Exercise Activities and Dietary recommendations Goals   None     Immunization History  Administered Date(s)  Administered  . Td 03/22/1998  . Tdap 09/27/2010    Health Maintenance  Topic Date Due  . Hepatitis C Screening  Never done  . COVID-19 Vaccine (1) Never done  . HIV Screening  Never done  . INFLUENZA VACCINE  01/30/2020  . MAMMOGRAM  03/22/2020  . TETANUS/TDAP  09/26/2020  . PAP SMEAR-Modifier  10/06/2021    Discussed health benefits of physical activity, and encouraged her to engage in regular exercise appropriate for her age and condition.  1. Encounter for annual physical exam  - CBC with Differential/Platelet - Comprehensive metabolic panel - TSH - Lipid panel  2. Class 2 obesity without serious comorbidity with body mass index (BMI) of 35.0 to 35.9 in adult, unspecified obesity type  - CBC with Differential/Platelet - Comprehensive metabolic panel - TSH - Lipid panel  3. GAD (generalized anxiety disorder)  - CBC with Differential/Platelet - Comprehensive metabolic panel - TSH - Lipid panel - buPROPion (WELLBUTRIN XL) 300 MG 24 hr tablet; Take 1 tablet (300 mg total) by mouth daily.  Dispense: 90 tablet; Refill: 3  4. Screening for cervical cancer  - Cytology - PAP  5. Family history of diabetes mellitus  - HgB A1c  6. Encounter for pregnancy test, result unknown  - POCT urine pregnancy  7. Intrauterine contraceptive device threads lost, initial encounter  US shows IUD in place. Counseled patient on expectant management of this. She may keep the device until which time she would like it removed, would need to visit  OBGYN. 2 - US Pelvic Complete With Transvaginal  8. Encounter for screening mammogram for malignant neoplasm of breast  - MM DIGITAL SCREENING BILATERAL   No follow-ups on file.     ITrinna Post, PA-C, have reviewed all documentation for this visit. The documentation on 02/29/20 for the exam, diagnosis, procedures, and orders are all accurate and complete.  The entirety of the information documented in the History of Present  Illness, Review of Systems and Physical Exam were personally obtained by me. Portions of this information were initially documented by Elonda Husky, CMA and reviewed by me for thoroughness and accuracy.   I spent 30 minutes dedicated to the care of this patient on the date of this encounter to include pre-visit review of records, face-to-face time with the patient discussing separate issues from CPE including anxiety and lost IUD strings, and post visit ordering of testing.    Paulene Floor  [facility] [phone] (phone) [phone] (fax)  [place]

## 2020-02-02 ENCOUNTER — Encounter: Payer: BC Managed Care – PPO | Admitting: Family Medicine

## 2020-02-02 ENCOUNTER — Ambulatory Visit (INDEPENDENT_AMBULATORY_CARE_PROVIDER_SITE_OTHER): Payer: BC Managed Care – PPO | Admitting: Physician Assistant

## 2020-02-02 ENCOUNTER — Encounter: Payer: Self-pay | Admitting: Physician Assistant

## 2020-02-02 ENCOUNTER — Other Ambulatory Visit: Payer: Self-pay

## 2020-02-02 ENCOUNTER — Other Ambulatory Visit (HOSPITAL_COMMUNITY)
Admission: RE | Admit: 2020-02-02 | Discharge: 2020-02-02 | Disposition: A | Discharge status: Home / Self Care | Payer: BC Managed Care – PPO | Source: Ambulatory Visit | Attending: Family Medicine | Admitting: Family Medicine

## 2020-02-02 VITALS — BP 117/83 | HR 93 | Temp 98.2°F | Wt 169.2 lb

## 2020-02-02 DIAGNOSIS — E669 Obesity, unspecified: Secondary | ICD-10-CM

## 2020-02-02 DIAGNOSIS — Z1231 Encounter for screening mammogram for malignant neoplasm of breast: Secondary | ICD-10-CM

## 2020-02-02 DIAGNOSIS — T8332XA Displacement of intrauterine contraceptive device, initial encounter: Secondary | ICD-10-CM

## 2020-02-02 DIAGNOSIS — Z6835 Body mass index (BMI) 35.0-35.9, adult: Secondary | ICD-10-CM

## 2020-02-02 DIAGNOSIS — Z124 Encounter for screening for malignant neoplasm of cervix: Secondary | ICD-10-CM | POA: Insufficient documentation

## 2020-02-02 DIAGNOSIS — F411 Generalized anxiety disorder: Secondary | ICD-10-CM

## 2020-02-02 DIAGNOSIS — Z3200 Encounter for pregnancy test, result unknown: Secondary | ICD-10-CM

## 2020-02-02 DIAGNOSIS — Z833 Family history of diabetes mellitus: Secondary | ICD-10-CM

## 2020-02-02 DIAGNOSIS — Z0000 Encounter for general adult medical examination without abnormal findings: Secondary | ICD-10-CM | POA: Diagnosis not present

## 2020-02-02 DIAGNOSIS — E66812 Obesity, class 2: Secondary | ICD-10-CM

## 2020-02-02 LAB — POCT URINE PREGNANCY: Preg Test, Ur: NEGATIVE

## 2020-02-02 MED ORDER — BUPROPION HCL ER (XL) 300 MG PO TB24: 300.0000 mg | ORAL_TABLET | Freq: Every day | ORAL | 3 refills | Status: DC

## 2020-02-02 MED ORDER — FLUTICASONE PROPIONATE 50 MCG/ACT NA SUSP: 2.0000 | Freq: Every day | NASAL | 3 refills | Status: AC

## 2020-02-03 ENCOUNTER — Other Ambulatory Visit: Payer: Self-pay

## 2020-02-03 ENCOUNTER — Ambulatory Visit
Admission: RE | Admit: 2020-02-03 | Discharge: 2020-02-03 | Disposition: A | Discharge status: Home / Self Care | Payer: BC Managed Care – PPO | Source: Ambulatory Visit | Attending: Physician Assistant | Admitting: Physician Assistant

## 2020-02-03 ENCOUNTER — Encounter: Payer: Self-pay | Admitting: Physician Assistant

## 2020-02-03 DIAGNOSIS — T8332XA Displacement of intrauterine contraceptive device, initial encounter: Secondary | ICD-10-CM | POA: Diagnosis not present

## 2020-02-04 ENCOUNTER — Encounter: Payer: Self-pay | Admitting: Physician Assistant

## 2020-02-04 ENCOUNTER — Telehealth: Payer: Self-pay

## 2020-02-04 LAB — TSH: TSH: 1.26 u[IU]/mL (ref 0.450–4.500)

## 2020-02-04 LAB — COMPREHENSIVE METABOLIC PANEL
ALT: 16 IU/L (ref 0–32)
AST: 19 IU/L (ref 0–40)
Albumin/Globulin Ratio: 2.2 (ref 1.2–2.2)
Albumin: 4.4 g/dL (ref 3.8–4.8)
Alkaline Phosphatase: 57 IU/L (ref 48–121)
BUN/Creatinine Ratio: 15 (ref 9–23)
BUN: 11 mg/dL (ref 6–24)
Bilirubin Total: 0.5 mg/dL (ref 0.0–1.2)
CO2: 19 mmol/L — ABNORMAL LOW (ref 20–29)
Calcium: 9.3 mg/dL (ref 8.7–10.2)
Chloride: 99 mmol/L (ref 96–106)
Creatinine, Ser: 0.75 mg/dL (ref 0.57–1.00)
GFR calc Af Amer: 112 mL/min/{1.73_m2} (ref >59)
GFR calc non Af Amer: 97 mL/min/{1.73_m2} (ref >59)
Globulin, Total: 2 g/dL (ref 1.5–4.5)
Glucose: 86 mg/dL (ref 65–99)
Potassium: 4.3 mmol/L (ref 3.5–5.2)
Sodium: 135 mmol/L (ref 134–144)
Total Protein: 6.4 g/dL (ref 6.0–8.5)

## 2020-02-04 LAB — CBC WITH DIFFERENTIAL/PLATELET
Basophils Absolute: 0 10*3/uL (ref 0.0–0.2)
Basos: 1 %
EOS (ABSOLUTE): 0 10*3/uL (ref 0.0–0.4)
Eos: 1 %
Hematocrit: 32.1 % — ABNORMAL LOW (ref 34.0–46.6)
Hemoglobin: 10.3 g/dL — ABNORMAL LOW (ref 11.1–15.9)
Immature Grans (Abs): 0 10*3/uL (ref 0.0–0.1)
Immature Granulocytes: 0 %
Lymphocytes Absolute: 0.7 10*3/uL (ref 0.7–3.1)
Lymphs: 23 %
MCH: 25.4 pg — ABNORMAL LOW (ref 26.6–33.0)
MCHC: 32.1 g/dL (ref 31.5–35.7)
MCV: 79 fL (ref 79–97)
Monocytes Absolute: 0.4 10*3/uL (ref 0.1–0.9)
Monocytes: 11 %
Neutrophils Absolute: 2 10*3/uL (ref 1.4–7.0)
Neutrophils: 64 %
Platelets: 319 10*3/uL (ref 150–450)
RBC: 4.06 x10E6/uL (ref 3.77–5.28)
RDW: 15 % (ref 11.7–15.4)
WBC: 3.2 10*3/uL — ABNORMAL LOW (ref 3.4–10.8)

## 2020-02-04 LAB — CYTOLOGY - PAP
Comment: NEGATIVE
Diagnosis: NEGATIVE
High risk HPV: NEGATIVE

## 2020-02-04 LAB — LIPID PANEL
Chol/HDL Ratio: 3.2 ratio (ref 0.0–4.4)
Cholesterol, Total: 186 mg/dL (ref 100–199)
HDL: 59 mg/dL (ref >39)
LDL Chol Calc (NIH): 117 mg/dL — ABNORMAL HIGH (ref 0–99)
Triglycerides: 54 mg/dL (ref 0–149)
VLDL Cholesterol Cal: 10 mg/dL (ref 5–40)

## 2020-02-04 LAB — HEMOGLOBIN A1C
Est. average glucose Bld gHb Est-mCnc: 105 mg/dL
Hgb A1c MFr Bld: 5.3 % (ref 4.8–5.6)

## 2020-02-04 NOTE — Telephone Encounter (Signed)
-----   Message from [name], [place] sent at 02/04/2020  8:18 AM EDT ----- Can we add an iron panel on under anemia? Thanks.

## 2020-02-04 NOTE — Telephone Encounter (Signed)
Iron panel has been added to patient's labs.

## 2020-02-05 LAB — IRON AND TIBC
Iron Saturation: 14 % — ABNORMAL LOW (ref 15–55)
Iron: 46 ug/dL (ref 27–159)
Total Iron Binding Capacity: 337 ug/dL (ref 250–450)
UIBC: 291 ug/dL (ref 131–425)

## 2020-02-05 LAB — SPECIMEN STATUS REPORT

## 2020-02-05 LAB — FERRITIN: Ferritin: 16 ng/mL (ref 15–150)

## 2020-02-08 ENCOUNTER — Other Ambulatory Visit: Payer: Self-pay | Admitting: Physician Assistant

## 2020-02-08 DIAGNOSIS — D649 Anemia, unspecified: Secondary | ICD-10-CM

## 2020-04-07 ENCOUNTER — Encounter: Payer: Self-pay | Admitting: Physician Assistant

## 2020-04-07 DIAGNOSIS — D649 Anemia, unspecified: Secondary | ICD-10-CM

## 2020-04-24 ENCOUNTER — Telehealth: Payer: Self-pay | Admitting: Podiatry

## 2020-04-24 NOTE — Telephone Encounter (Signed)
LVM FOR PATIENT TO RETURN CALL TO GET APPOINTMENT SCHEDULED WITH HYATT FROM APPOINTMENT REQUEST

## 2020-04-26 ENCOUNTER — Ambulatory Visit (INDEPENDENT_AMBULATORY_CARE_PROVIDER_SITE_OTHER): Payer: BC Managed Care – PPO

## 2020-04-26 ENCOUNTER — Other Ambulatory Visit: Payer: Self-pay

## 2020-04-26 ENCOUNTER — Ambulatory Visit: Payer: BC Managed Care – PPO | Admitting: Podiatry

## 2020-04-26 ENCOUNTER — Encounter: Payer: Self-pay | Admitting: Podiatry

## 2020-04-26 DIAGNOSIS — M722 Plantar fascial fibromatosis: Secondary | ICD-10-CM

## 2020-04-26 NOTE — Patient Instructions (Signed)

## 2020-04-26 NOTE — Progress Notes (Signed)
Subjective:  Patient ID: Alisha Miller, female    DOB: 04-15-1976,  MRN: [id number] HPI Chief Complaint  Patient presents with  . Foot Pain    Plantar heel and plantar/lateral side left - aching x 2 months, sometimes sharp through heel, swelling and tingling along the side, rolls with tennis ball and uses massager  . New Patient (Initial Visit)    44 y.o. female presents with the above complaint.   ROS: Denies fever chills nausea vomiting muscle aches pains calf pain back pain chest pain shortness of breath.  States this started about 2 months ago she is trying all-natural means to get rid of this.  States that she no longer has orthotics that she received from [place] many years ago.  She states that she has been using massage therapy and chiropractic medicine.  Wants to know if acupuncture will help.  Past Medical History:  Diagnosis Date  . Allergy   . Anxiety   . Asthma   . Depression   . Endometriosis   . HSV (herpes simplex virus) infection   . Rosacea    Past Surgical History:  Procedure Laterality Date  . DILATION AND CURETTAGE OF UTERUS    . LAPAROSCOPY      Current Outpatient Medications:  .  buPROPion (WELLBUTRIN XL) 300 MG 24 hr tablet, Take 1 tablet (300 mg total) by mouth daily., Disp: 90 tablet, Rfl: 3 .  doxycycline (VIBRAMYCIN) 50 MG capsule, , Disp: , Rfl:  .  fluticasone (FLONASE) 50 MCG/ACT nasal spray, Place 2 sprays into both nostrils daily., Disp: 16 g, Rfl: 3 .  levonorgestrel (MIRENA) 20 MCG/24HR IUD, by Intrauterine route., Disp: , Rfl:  .  loratadine (CLARITIN) 10 MG tablet, Take 10 mg by mouth daily., Disp: , Rfl:  .  SULFACLEANSE 8/4 8-4 % SUSP, , Disp: , Rfl:  .  valACYclovir (VALTREX) 500 MG tablet, , Disp: , Rfl:   Allergies  Allergen Reactions  . Erythromycin Other (See Comments)   Review of Systems Objective:  There were no vitals filed for this visit.  General: Well developed, nourished, in no acute distress, alert and oriented x3    Dermatological: Skin is warm, dry and supple bilateral. Nails x 10 are well maintained; remaining integument appears unremarkable at this time. There are no open sores, no preulcerative lesions, no rash or signs of infection present.  Vascular: Dorsalis Pedis artery and Posterior Tibial artery pedal pulses are 2/4 bilateral with immedate capillary fill time. Pedal hair growth present. No varicosities and no lower extremity edema present bilateral.   Neruologic: Grossly intact via light touch bilateral. Vibratory intact via tuning fork bilateral. Protective threshold with Semmes Wienstein monofilament intact to all pedal sites bilateral. Patellar and Achilles deep tendon reflexes 2+ bilateral. No Babinski or clonus noted bilateral.   Musculoskeletal: No gross boney pedal deformities bilateral. No pain, crepitus, or limitation noted with foot and ankle range of motion bilateral. Muscular strength 5/5 in all groups tested bilateral.  She has pain on palpation medial care tubercle of the left heel.  She has no pain on medial lateral compression of the calcaneus.  Gait: Unassisted, Nonantalgic.    Radiographs:  Radiographs taken today demonstrate osseously mature individual.  No acute findings soft tissue increase in density of plantar fashion calcaneal insertion site left heel.  Assessment & Plan:   Assessment: Plan fasciitis left.  Plan: Discussed etiology pathology conservative versus surgical therapies.  At this point offered her a Medrol Dosepak and nonsteroidal  she declined.  I did inject her left heel today 20 mg Kenalog 5 mg Marcaine dispensed a plantar fascial brace discussed appropriate shoe gear stretching exercises ice therapy and shoe gear modifications.  We also discussed the need for orthotics and I will follow-up with her in 1 month.     Winslow Ederer T. [place], [place]

## 2020-05-11 ENCOUNTER — Encounter: Payer: Self-pay | Admitting: Family Medicine

## 2020-05-11 ENCOUNTER — Other Ambulatory Visit: Payer: Self-pay

## 2020-05-11 ENCOUNTER — Ambulatory Visit (INDEPENDENT_AMBULATORY_CARE_PROVIDER_SITE_OTHER): Payer: BC Managed Care – PPO | Admitting: Family Medicine

## 2020-05-11 VITALS — BP 97/67 | HR 83 | Temp 98.8°F | Resp 16 | Wt 172.8 lb

## 2020-05-11 DIAGNOSIS — R591 Generalized enlarged lymph nodes: Secondary | ICD-10-CM | POA: Diagnosis not present

## 2020-05-11 DIAGNOSIS — D649 Anemia, unspecified: Secondary | ICD-10-CM | POA: Insufficient documentation

## 2020-05-11 NOTE — Patient Instructions (Signed)

## 2020-05-11 NOTE — Assessment & Plan Note (Addendum)
Mild reactive lymphadenopathy, no signs suggestive of malignancy Offered reassurance Recheck at next follow up

## 2020-05-11 NOTE — Assessment & Plan Note (Signed)
HgB of 10.5 at last visit, possibly 2/2 to donating blood Recheck CBC

## 2020-05-11 NOTE — Progress Notes (Signed)
Established patient visit   Patient: Alisha Miller   DOB: 02-22-1976   44 y.o. Female  MRN: [id number] Visit Date: 05/11/2020  Today's healthcare provider: Lavon Paganini, MD   Chief Complaint  Patient presents with  . Mass   Subjective    HPI   Alisha Miller presents with an 8 day history of a lump that she noticed on the left side of her neck. The lump was originally just larger than a pea or a blueberry but has since retreated in size. The lump was originally tender but she is unsure if that is because of her frequent manipulation of the lump, it is not currently tender. She had mild 'vertigo' and headaches when she turned toward her head toward the R initially but no longer experiences symptoms. She denies flu symptoms, fevers, weight loss, night sweats, easy bruising or fatigue. She has had no N/V/D. SOB or chest pain.      Social History   Tobacco Use  . Smoking status: Former Smoker    Types: Cigarettes    Quit date: 08/30/2018    Years since quitting: 1.6  . Smokeless tobacco: Never Used  Vaping Use  . Vaping Use: Never used  Substance Use Topics  . Alcohol use: Yes    Alcohol/week: 13.0 standard drinks    Types: 7 Glasses of wine, 6 Cans of beer per week  . Drug use: Never       Medications: Outpatient Medications Prior to Visit  Medication Sig  . buPROPion (WELLBUTRIN XL) 300 MG 24 hr tablet Take 1 tablet (300 mg total) by mouth daily.  Marland Kitchen doxycycline (VIBRAMYCIN) 50 MG capsule   . fluticasone (FLONASE) 50 MCG/ACT nasal spray Place 2 sprays into both nostrils daily.  Marland Kitchen levonorgestrel (MIRENA) 20 MCG/24HR IUD by Intrauterine route.  . loratadine (CLARITIN) 10 MG tablet Take 10 mg by mouth daily.  . SULFACLEANSE 8/4 8-4 % SUSP   . Turmeric (QC TUMERIC COMPLEX PO) Take by mouth.  . valACYclovir (VALTREX) 500 MG tablet    No facility-administered medications prior to visit.    Review of Systems  Constitutional: Negative.   HENT: Negative.   Eyes:  Negative.   Respiratory: Negative.   Cardiovascular: Negative.   Gastrointestinal: Negative.   Endocrine: Negative.   Genitourinary: Negative.   Musculoskeletal: Negative.   Allergic/Immunologic: Negative.   Neurological: Negative.   Hematological: Negative.   Psychiatric/Behavioral: Negative.       Objective    BP 97/67 (BP Location: Left Arm, Patient Position: Sitting, Cuff Size: Large)   Pulse 83   Temp 98.8 F (37.1 C) (Oral)   Resp 16   Wt 172 lb 12.8 oz (78.4 kg)   BMI 31.61 kg/m    Physical Exam   General: Well appearing, conversational, in NAD Lungs: CTA bilaterally Cards: RRR no murmurs rubs or gallops HEENT: 3 mildly enlarged lymph nodes in the left neck just posterior to SCM, soft, non tender and mobile   No results found for any visits on 05/11/20.  Assessment & Plan     Problem List Items Addressed This Visit      Immune and Lymphatic   Lymphadenopathy - Primary    Mild reactive lymphadenopathy, no signs suggestive of malignancy Offered reassurance Recheck at next follow up        Other   Normocytic anemia    HgB of 10.5 at last visit, possibly 2/2 to donating blood Recheck CBC      Relevant  Orders   CBC       Return if symptoms worsen or fail to improve.      Patient seen along with [employer] student [facility]. I personally evaluated this patient along with the student, and verified all aspects of the history, physical exam, and medical decision making as documented by the student. I agree with the student's documentation and have made all necessary edits.  Bedelia Pong, Dionne Bucy, MD, MPH [facility]

## 2020-05-12 ENCOUNTER — Telehealth: Payer: Self-pay

## 2020-05-12 LAB — CBC
Hematocrit: 38 % (ref 34.0–46.6)
Hemoglobin: 12.1 g/dL (ref 11.1–15.9)
MCH: 24.8 pg — ABNORMAL LOW (ref 26.6–33.0)
MCHC: 31.8 g/dL (ref 31.5–35.7)
MCV: 78 fL — ABNORMAL LOW (ref 79–97)
Platelets: 320 10*3/uL (ref 150–450)
RBC: 4.87 x10E6/uL (ref 3.77–5.28)
RDW: 16.6 % — ABNORMAL HIGH (ref 11.7–15.4)
WBC: 6 10*3/uL (ref 3.4–10.8)

## 2020-05-12 NOTE — Telephone Encounter (Signed)
Written by Virginia Crews, MD on 05/12/2020 9:47 AM EST Seen by patient Alisha Miller on 05/12/2020 9:53 AM

## 2020-05-12 NOTE — Telephone Encounter (Signed)
-----   Message from Virginia Crews, MD sent at 05/12/2020  9:47 AM EST ----- Hemoglobin has normalized. No more anemia.

## 2020-05-31 ENCOUNTER — Encounter: Payer: BC Managed Care – PPO | Admitting: Podiatry

## 2020-06-07 ENCOUNTER — Other Ambulatory Visit: Payer: Self-pay

## 2020-06-07 ENCOUNTER — Encounter: Payer: Self-pay | Admitting: Podiatry

## 2020-06-07 ENCOUNTER — Ambulatory Visit: Payer: BC Managed Care – PPO | Admitting: Podiatry

## 2020-06-07 DIAGNOSIS — M722 Plantar fascial fibromatosis: Secondary | ICD-10-CM | POA: Diagnosis not present

## 2020-06-07 MED ORDER — TRIAMCINOLONE ACETONIDE 40 MG/ML IJ SUSP
20.0000 mg | Freq: Once | INTRAMUSCULAR | Status: AC
  Administered 2020-06-07: 20 mg

## 2020-06-07 NOTE — Progress Notes (Signed)
She presents today for follow-up of her plantar fasciitis states that she is about 80% improved she refers to her left heel.  Objective: Vital signs are stable alert oriented x3 pulses are palpable.  She continues to get her acupuncture.  She has pain on palpation mid calcaneal tubercle but much less than previously noted.  Assessment: Resolving plantar fasciitis left.  Plan: May need to consider orthotics with her and referred her to [facility] for orthotics but I went ahead and injected her today with 20 mg Kenalog 5 mg Marcaine point of maximal tenderness of the left heel.

## 2020-06-15 ENCOUNTER — Encounter: Payer: Self-pay | Admitting: Family Medicine

## 2020-06-21 ENCOUNTER — Other Ambulatory Visit: Payer: Self-pay

## 2020-06-21 ENCOUNTER — Ambulatory Visit (INDEPENDENT_AMBULATORY_CARE_PROVIDER_SITE_OTHER): Payer: BC Managed Care – PPO | Admitting: Orthotics

## 2020-06-21 DIAGNOSIS — M722 Plantar fascial fibromatosis: Secondary | ICD-10-CM

## 2020-06-21 NOTE — Progress Notes (Signed)

## 2020-07-19 ENCOUNTER — Encounter: Payer: BC Managed Care – PPO | Admitting: Orthotics

## 2020-07-19 ENCOUNTER — Other Ambulatory Visit: Payer: Self-pay

## 2020-11-13 ENCOUNTER — Encounter: Payer: Self-pay | Admitting: Family Medicine

## 2020-12-29 ENCOUNTER — Encounter: Payer: Self-pay | Admitting: Family Medicine

## 2020-12-29 NOTE — Telephone Encounter (Signed)
Please review. Ok to refill?  

## 2021-01-05 ENCOUNTER — Telehealth: Payer: Self-pay

## 2021-01-05 MED ORDER — VALACYCLOVIR HCL 500 MG PO TABS: 500.0000 mg | ORAL_TABLET | Freq: Two times a day (BID) | ORAL | 2 refills | Status: DC

## 2021-01-05 NOTE — Telephone Encounter (Signed)
Yes please refill and apologize for the delay. Thanks!

## 2021-01-05 NOTE — Telephone Encounter (Signed)
Refill for [facility]trex sent to [facility].

## 2021-02-02 ENCOUNTER — Other Ambulatory Visit: Payer: Self-pay

## 2021-02-02 ENCOUNTER — Encounter: Payer: Self-pay | Admitting: Family Medicine

## 2021-02-02 ENCOUNTER — Ambulatory Visit: Payer: BC Managed Care – PPO | Admitting: Family Medicine

## 2021-02-02 VITALS — BP 118/79 | HR 92 | Temp 98.5°F | Wt 187.0 lb

## 2021-02-02 DIAGNOSIS — Z833 Family history of diabetes mellitus: Secondary | ICD-10-CM | POA: Diagnosis not present

## 2021-02-02 DIAGNOSIS — F411 Generalized anxiety disorder: Secondary | ICD-10-CM

## 2021-02-02 DIAGNOSIS — Z0000 Encounter for general adult medical examination without abnormal findings: Secondary | ICD-10-CM

## 2021-02-02 DIAGNOSIS — Z1231 Encounter for screening mammogram for malignant neoplasm of breast: Secondary | ICD-10-CM

## 2021-02-02 DIAGNOSIS — Z5320 Procedure and treatment not carried out because of patient's decision for unspecified reasons: Secondary | ICD-10-CM

## 2021-02-02 DIAGNOSIS — F3341 Major depressive disorder, recurrent, in partial remission: Secondary | ICD-10-CM

## 2021-02-02 DIAGNOSIS — N951 Menopausal and female climacteric states: Secondary | ICD-10-CM | POA: Diagnosis not present

## 2021-02-02 DIAGNOSIS — E669 Obesity, unspecified: Secondary | ICD-10-CM

## 2021-02-02 DIAGNOSIS — Z114 Encounter for screening for human immunodeficiency virus [HIV]: Secondary | ICD-10-CM

## 2021-02-02 DIAGNOSIS — Z23 Encounter for immunization: Secondary | ICD-10-CM | POA: Diagnosis not present

## 2021-02-02 DIAGNOSIS — Z1159 Encounter for screening for other viral diseases: Secondary | ICD-10-CM

## 2021-02-02 DIAGNOSIS — Z1211 Encounter for screening for malignant neoplasm of colon: Secondary | ICD-10-CM

## 2021-02-02 DIAGNOSIS — Z6834 Body mass index (BMI) 34.0-34.9, adult: Secondary | ICD-10-CM

## 2021-02-02 NOTE — Assessment & Plan Note (Signed)
Discussed importance of healthy weight management Discussed diet and exercise  

## 2021-02-02 NOTE — Assessment & Plan Note (Signed)
Chronic and well controlled Continue therapy and wellbutrin

## 2021-02-02 NOTE — Progress Notes (Signed)
Complete physical exam   Patient: Alisha Miller   DOB: [birth year]   45 y.o. Female  MRN: [id number] Visit Date: 02/02/2021  Today's healthcare provider: Lavon Paganini, MD   Chief Complaint  Patient presents with   Annual Exam    Subjective    Alisha Miller is a 45 y.o. female who presents today for a complete physical exam.  She reports consuming a  diet.  She generally feels well. She reports sleeping well. She does have additional problems to discuss today.  HPI   Vaginal Dryness  She sent a message through MyChart about perimenopause. She is having issues with dryness and attraction. She reports having outside stressors that could play a role but she is just curious if her symptoms are related to stress or hormonal issues. She is also interested in a medication that provide relief for dryness.   Screenings  She had a normal Pap with negative HPV in August 2021. She is amenable to referral for a mammogram and colonoscopy.   Vaccines  She is willing to receive the tetanus and has received the first COVID vaccine but no additional boosters.   Breakthrough Bleeding She started an exercise routine and had some breakthrough bleeding with the IUD. She reports in been a while since she had any bleeding. She reports her strings to her IUD can't be detected and she received an ultrasound to determine if it is correctly placed.  She speaks with a counselor and she admits to not being as compliant as she should with the 300 mg wellbutrin. She is encouraged to take her medication more consistently.  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  Other Topics Concern   Not on file  Social History Narrative   Not on file   Social Determinants of Health   Financial Resource Strain: Not on file  Food Insecurity: Not on file  Transportation Needs: Not on file  Physical Activity: Not on file  Stress: Not on file  Social Connections: Not on file  Intimate Partner Violence: Not on file   Family Status  Relation Name Status   Alisha Miller  (Not Specified)   [name]  Deceased   [name]  Deceased   Mother  Deceased   Father  Alive   Alisha Miller  (Not  Specified)   [name]  Deceased   [name]  Deceased   Family History  Problem Relation Age of Onset   Breast cancer Paternal 55    Cancer Paternal Aunt    Diabetes Paternal 74    Breast cancer Maternal Grandmother    Dementia Maternal Grandmother    Cervical cancer Paternal Grandmother    Cancer Paternal Grandmother    Alzheimer's disease Mother    Diabetes Mother    Heart failure Mother    Arthritis Mother    Diabetes Father    Hypertension Father    Hyperlipidemia Father    Atrial fibrillation Father    Heart disease Father    Prostate cancer Father    Heart disease Paternal Uncle    Hypertension Paternal Uncle    Heart attack Maternal Grandfather    Diabetes Maternal Grandfather    Hypertension Maternal Grandfather    Heart attack Paternal Grandfather    Alcohol abuse Paternal Grandfather    Allergies   Allergen Reactions   Erythromycin Other (See Comments)    Patient Care Team: Virginia Crews, MD as PCP - General (Family Medicine)   Medications: Outpatient Medications Prior to Visit  Medication Sig   buPROPion (WELLBUTRIN XL) 300 MG 24 hr tablet Take 1 tablet (300 mg total) by mouth daily.   doxycycline (VIBRAMYCIN) 50 MG capsule    fluticasone (FLONASE) 50 MCG/ACT nasal spray Place 2 sprays into both nostrils daily.   levonorgestrel (MIRENA) 20 MCG/24HR IUD by Intrauterine route.   loratadine (CLARITIN) 10 MG tablet Take 10 mg by mouth daily.   SULFACLEANSE 8/4 8-4 % SUSP    Turmeric (QC TUMERIC COMPLEX PO) Take by mouth.   valACYclovir (VALTREX) 500 MG tablet Take 1 tablet (500 mg total) by mouth 2 (two) times daily.   No facility-administered medications prior to visit.    Review of Systems  Constitutional: Negative.  Negative for chills, diaphoresis, fatigue and fever.  HENT: Negative.  Negative for ear pain, sinus pressure, sinus pain and sore throat.   Eyes: Negative.  Negative for pain and visual disturbance.  Respiratory: Negative.  Negative for cough, chest tightness, shortness of breath and wheezing.   Cardiovascular: Negative.  Negative for chest pain, palpitations and leg swelling.  Gastrointestinal: Negative.  Negative for abdominal pain, blood in stool, constipation, diarrhea, nausea and vomiting.  Endocrine: Negative.   Genitourinary: Negative.  Negative for flank pain, frequency, pelvic pain and urgency.  Musculoskeletal: Negative.  Negative for back pain, myalgias and neck pain.  Skin: Negative.   Allergic/Immunologic: Positive for environmental allergies.  Neurological: Negative.  Negative for dizziness, seizures, syncope, weakness, light-headedness, numbness and headaches.  Hematological: Negative.   Psychiatric/Behavioral: Negative.       Objective    BP 118/79 (BP Location: Right Arm, Patient Position: Sitting, Cuff Size: Normal)   Pulse 92    Temp 98.5 F (36.9 C) (Oral)   Wt 187 lb (84.8 kg)   SpO2 100%   BMI 34.20 kg/m    Physical Exam Constitutional:      Appearance: Normal appearance. She is normal weight.  HENT:     Head: Normocephalic and atraumatic.     Right Ear: Tympanic membrane, ear canal and external ear normal.     Left Ear: Tympanic membrane, ear canal and external ear normal.     Nose: Nose normal.     Mouth/Throat:     Mouth: Mucous membranes are moist.     Pharynx: Oropharynx is clear.  Eyes:     Extraocular Movements: Extraocular movements intact.     Conjunctiva/sclera: Conjunctivae normal.     Pupils: Pupils are equal, round, and reactive to light.  Cardiovascular:     Rate and Rhythm: Normal rate and regular rhythm.     Pulses: Normal pulses.     Heart sounds: Normal heart sounds.  Pulmonary:     Effort: Pulmonary effort is normal.     Breath sounds: Normal breath sounds.  Abdominal:     General: Abdomen is flat. Bowel sounds are normal.     Palpations: Abdomen is soft.  Musculoskeletal:        General: Normal range of motion.     Cervical back: Normal range of motion and neck supple.  Skin:    General: Skin is warm and dry.  Neurological:     General: No focal deficit present.     Mental Status: She is alert and oriented to person, place, and time. Mental status is at baseline.  Psychiatric:        Mood and Affect: Mood normal.        Behavior: Behavior normal.        Thought Content: Thought content normal.        Judgment: Judgment normal.    Last depression screening scores PHQ 2/9 Scores 02/02/2021 05/11/2020 02/02/2020  PHQ - 2 Score '3 1 2  '$ PHQ- 9 Score '5 1 8   '$ Last fall risk screening Fall Risk  02/02/2021  Falls in the past year? 1  Number falls in past yr: 1  Injury with Fall? 0  Follow up -   Last Audit-C alcohol use screening Alcohol Use Disorder Test (AUDIT) 02/02/2021  1. How often do you have a drink containing alcohol? 3  2. How many drinks containing alcohol do you  have on a typical day when you are drinking? 0  3. How often do you have six or more drinks on one occasion? 1  AUDIT-C Score 4  4. How often during the last year have you found that you were not able to stop drinking once you had started? -  5. How often during the last year have you failed to do what was normally expected from you because of drinking? -  6. How often during the last year have you needed a first drink in the morning to get yourself going after a heavy drinking session? -  7. How often during the last year have you had a feeling of guilt of remorse after drinking? -  8. How often during the last year have you been unable to remember what happened the night before because you had been drinking? -  9. Have you or someone else been injured as a result of your drinking? -  10. Has a relative or friend or a doctor or another health worker been concerned about your drinking or suggested you cut down? -  Alcohol Use Disorder Identification Test Final Score (AUDIT) -   A score of 3 or more in women, and 4 or more in men indicates increased risk for alcohol abuse, EXCEPT if all of the points are from question 1   No results found for any visits on 02/02/21.  Assessment & Plan     Problem List Items Addressed This Visit       Other   Family history of diabetes mellitus   Relevant Orders   Hemoglobin A1c   Recurrent Miller depressive disorder, in partial remission (  [place])    Chronic and well controlled Continue therapy and wellbutrin       GAD (generalized anxiety disorder)    Chronic and well controlled Continue therapy and wellbutrin       Class 1 obesity without serious comorbidity with body mass index (BMI) of 34.0 to 34.9 in adult    Discussed importance of healthy weight management Discussed diet and exercise        Relevant Orders   CBC with Differential/Platelet   Comprehensive metabolic panel   Lipid Panel With LDL/HDL Ratio   Hemoglobin A1c   Other Visit  Diagnoses     Encounter for annual physical exam    -  Primary   Relevant Orders   CBC with Differential/Platelet   Comprehensive metabolic panel   Lipid Panel With LDL/HDL Ratio   MM 3D SCREEN BREAST BILATERAL   Hepatitis C Antibody   HIV antibody (with reflex)   FSH/LH   Ambulatory referral to Gastroenterology   Hemoglobin A1c   Perimenopausal       Relevant Orders   FSH/LH   Colon cancer screening       Relevant Orders   Ambulatory referral to Gastroenterology   Screening for hepatitis C declined       Need for hepatitis C screening test       Relevant Orders   Hepatitis C Antibody   Encounter for screening for HIV       Relevant Orders   HIV antibody (with reflex)   Encounter for screening mammogram for malignant neoplasm of breast       Relevant Orders   MM 3D SCREEN BREAST BILATERAL       Routine Health Maintenance and Physical Exam  Exercise Activities and Dietary recommendations  Goals   None     Immunization History  Administered Date(s) Administered   Td 03/22/1998   Tdap 09/27/2010    Health Maintenance  Topic Date Due   COVID-19 Vaccine (1) Never done   Hepatitis C Screening  Never done   MAMMOGRAM  03/22/2020   COLONOSCOPY (Pts 45-13yr Insurance coverage will need to be confirmed)  Never done   TETANUS/TDAP  09/26/2020   INFLUENZA VACCINE  01/29/2021   PAP SMEAR-Modifier  02/01/2025   HIV Screening  Completed   Pneumococcal Vaccine [id number] old  Aged Out   HPV VACCINES  Aged Out   Discussed health benefits of physical activity, and encouraged her to engage in regular exercise appropriate for her age and condition.   Return in about 1 year (around 02/02/2022) for CPE.     I,Essence Turner,acting as a sEducation administratorfor ALavon Paganini MD.,have documented all relevant documentation on the behalf of ALavon Paganini MD,as directed by  ALavon Paganini MD while in the presence of ALavon Paganini MD.  I, ALavon Paganini MD, have  reviewed all documentation for this visit. The documentation on 02/02/21 for the exam, diagnosis, procedures, and orders are all accurate and complete.   Aerial Dilley, ADionne Bucy MD, MPH [facility]

## 2021-02-19 NOTE — Addendum Note (Signed)
Addended by: Althea Charon D on: 02/19/2021 12:57 PM   Modules accepted: Orders

## 2021-02-22 ENCOUNTER — Other Ambulatory Visit: Payer: Self-pay

## 2021-02-22 ENCOUNTER — Ambulatory Visit
Admission: RE | Admit: 2021-02-22 | Discharge: 2021-02-22 | Disposition: A | Discharge status: Home / Self Care | Payer: BC Managed Care – PPO | Source: Ambulatory Visit | Attending: Family Medicine | Admitting: Family Medicine

## 2021-02-22 DIAGNOSIS — Z1231 Encounter for screening mammogram for malignant neoplasm of breast: Secondary | ICD-10-CM | POA: Insufficient documentation

## 2021-02-22 DIAGNOSIS — Z0000 Encounter for general adult medical examination without abnormal findings: Secondary | ICD-10-CM

## 2021-02-26 ENCOUNTER — Other Ambulatory Visit: Payer: Self-pay | Admitting: Gastroenterology

## 2021-02-26 ENCOUNTER — Telehealth (INDEPENDENT_AMBULATORY_CARE_PROVIDER_SITE_OTHER): Payer: Self-pay | Admitting: Gastroenterology

## 2021-02-26 DIAGNOSIS — Z1211 Encounter for screening for malignant neoplasm of colon: Secondary | ICD-10-CM

## 2021-02-26 MED ORDER — NA SULFATE-K SULFATE-MG SULF 17.5-3.13-1.6 GM/177ML PO SOLN: 1.0000 | Freq: Once | ORAL | 0 refills | Status: DC

## 2021-02-26 NOTE — Progress Notes (Signed)
Gastroenterology Pre-Procedure Review  Request Date: 04/13/21 Requesting Physician: Dr. Bonna Gains  PATIENT REVIEW QUESTIONS: The patient responded to the following health history questions as indicated:    1. Are you having any GI issues? yes (some constipation at times.) 2. Do you have a personal history of Polyps? no 3. Do you have a family history of Colon Cancer or Polyps? yes (father and mother colon polyps) 4. Diabetes Mellitus? no 5. Joint replacements in the past 12 months?no 6. Major health problems in the past 3 months?no 7. Any artificial heart valves, MVP, or defibrillator?no    MEDICATIONS & ALLERGIES:    Patient reports the following regarding taking any anticoagulation/antiplatelet therapy:   Plavix, Coumadin, Eliquis, Xarelto, Lovenox, Pradaxa, Brilinta, or Effient? no Aspirin? no  Patient confirms/reports the following medications:  Current Outpatient Medications  Medication Sig Dispense Refill   buPROPion (WELLBUTRIN XL) 300 MG 24 hr tablet Take 1 tablet (300 mg total) by mouth daily. 90 tablet 3   doxycycline (VIBRAMYCIN) 50 MG capsule      fluticasone (FLONASE) 50 MCG/ACT nasal spray Place 2 sprays into both nostrils daily. 16 g 3   levonorgestrel (MIRENA) 20 MCG/24HR IUD by Intrauterine route.     loratadine (CLARITIN) 10 MG tablet Take 10 mg by mouth daily.     SULFACLEANSE 8/4 8-4 % SUSP      Turmeric (QC TUMERIC COMPLEX PO) Take by mouth.     valACYclovir (VALTREX) 500 MG tablet Take 1 tablet (500 mg total) by mouth 2 (two) times daily. 12 tablet 2   No current facility-administered medications for this visit.    Patient confirms/reports the following allergies:  Allergies  Allergen Reactions   Erythromycin Other (See Comments)    No orders of the defined types were placed in this encounter.   AUTHORIZATION INFORMATION Primary Insurance: 1D#: Group #:  Secondary Insurance: 1D#: Group #:  SCHEDULE INFORMATION: Date:  04/13/21 Time: Location: [place]

## 2021-02-26 NOTE — Telephone Encounter (Signed)
Pharmacy requested a replacement bowel prep. New prep sent to pharmacy of choice.

## 2021-03-23 ENCOUNTER — Telehealth: Payer: Self-pay

## 2021-03-23 NOTE — Telephone Encounter (Signed)
[facility] request for the following medications:  buPROPion (WELLBUTRIN XL) 300 MG 24 hr tablet    Please advise.

## 2021-03-26 ENCOUNTER — Other Ambulatory Visit: Payer: Self-pay

## 2021-03-26 DIAGNOSIS — F411 Generalized anxiety disorder: Secondary | ICD-10-CM

## 2021-03-26 MED ORDER — BUPROPION HCL ER (XL) 300 MG PO TB24: 300.0000 mg | ORAL_TABLET | Freq: Every day | ORAL | 3 refills | Status: DC

## 2021-04-12 ENCOUNTER — Encounter: Payer: Self-pay | Admitting: Gastroenterology

## 2021-04-13 ENCOUNTER — Other Ambulatory Visit: Payer: Self-pay

## 2021-04-13 ENCOUNTER — Encounter: Payer: Self-pay | Admitting: Gastroenterology

## 2021-04-13 ENCOUNTER — Ambulatory Visit
Admission: RE | Admit: 2021-04-13 | Discharge: 2021-04-13 | Disposition: A | Discharge status: Home / Self Care | Payer: BC Managed Care – PPO | Attending: Gastroenterology | Admitting: Gastroenterology

## 2021-04-13 ENCOUNTER — Ambulatory Visit: Payer: BC Managed Care – PPO | Admitting: Anesthesiology

## 2021-04-13 ENCOUNTER — Encounter
Admission: RE | Disposition: A | Discharge status: Home / Self Care | Payer: Self-pay | Source: Home / Self Care | Attending: Gastroenterology

## 2021-04-13 DIAGNOSIS — Z809 Family history of malignant neoplasm, unspecified: Secondary | ICD-10-CM | POA: Insufficient documentation

## 2021-04-13 DIAGNOSIS — Z8042 Family history of malignant neoplasm of prostate: Secondary | ICD-10-CM | POA: Insufficient documentation

## 2021-04-13 DIAGNOSIS — Z8249 Family history of ischemic heart disease and other diseases of the circulatory system: Secondary | ICD-10-CM | POA: Diagnosis not present

## 2021-04-13 DIAGNOSIS — Z8261 Family history of arthritis: Secondary | ICD-10-CM | POA: Diagnosis not present

## 2021-04-13 DIAGNOSIS — Z87891 Personal history of nicotine dependence: Secondary | ICD-10-CM | POA: Diagnosis not present

## 2021-04-13 DIAGNOSIS — Z1211 Encounter for screening for malignant neoplasm of colon: Secondary | ICD-10-CM | POA: Diagnosis present

## 2021-04-13 DIAGNOSIS — Z8049 Family history of malignant neoplasm of other genital organs: Secondary | ICD-10-CM | POA: Insufficient documentation

## 2021-04-13 DIAGNOSIS — Z8349 Family history of other endocrine, nutritional and metabolic diseases: Secondary | ICD-10-CM | POA: Insufficient documentation

## 2021-04-13 DIAGNOSIS — K5730 Diverticulosis of large intestine without perforation or abscess without bleeding: Secondary | ICD-10-CM | POA: Insufficient documentation

## 2021-04-13 DIAGNOSIS — Z803 Family history of malignant neoplasm of breast: Secondary | ICD-10-CM | POA: Diagnosis not present

## 2021-04-13 DIAGNOSIS — Z833 Family history of diabetes mellitus: Secondary | ICD-10-CM | POA: Diagnosis not present

## 2021-04-13 DIAGNOSIS — K635 Polyp of colon: Secondary | ICD-10-CM | POA: Diagnosis not present

## 2021-04-13 DIAGNOSIS — D125 Benign neoplasm of sigmoid colon: Secondary | ICD-10-CM | POA: Diagnosis not present

## 2021-04-13 DIAGNOSIS — Z881 Allergy status to other antibiotic agents status: Secondary | ICD-10-CM | POA: Diagnosis not present

## 2021-04-13 DIAGNOSIS — Z793 Long term (current) use of hormonal contraceptives: Secondary | ICD-10-CM | POA: Diagnosis not present

## 2021-04-13 DIAGNOSIS — Z79899 Other long term (current) drug therapy: Secondary | ICD-10-CM | POA: Insufficient documentation

## 2021-04-13 HISTORY — PX: COLONOSCOPY WITH PROPOFOL: SHX5780

## 2021-04-13 LAB — POCT PREGNANCY, URINE: Preg Test, Ur: NEGATIVE

## 2021-04-13 SURGERY — COLONOSCOPY WITH PROPOFOL
Anesthesia: General

## 2021-04-13 MED ORDER — PROPOFOL 500 MG/50ML IV EMUL
INTRAVENOUS | Status: DC | PRN
  Administered 2021-04-13: 165 ug/kg/min via INTRAVENOUS

## 2021-04-13 MED ORDER — LIDOCAINE HCL (CARDIAC) PF 100 MG/5ML IV SOSY
PREFILLED_SYRINGE | INTRAVENOUS | Status: DC | PRN
  Administered 2021-04-13: 80 mg via INTRAVENOUS
  Administered 2021-04-13: 20 mg via INTRAVENOUS

## 2021-04-13 MED ORDER — PROPOFOL 10 MG/ML IV BOLUS
INTRAVENOUS | Status: DC | PRN
Start: 2021-04-13 — End: 2021-04-13
  Administered 2021-04-13: 20 mg via INTRAVENOUS
  Administered 2021-04-13: 50 mg via INTRAVENOUS
  Administered 2021-04-13: 10 mg via INTRAVENOUS

## 2021-04-13 MED ORDER — PROPOFOL 10 MG/ML IV BOLUS
INTRAVENOUS | Status: AC
  Filled 2021-04-13: qty 20

## 2021-04-13 MED ORDER — LIDOCAINE HCL (PF) 2 % IJ SOLN
INTRAMUSCULAR | Status: AC
  Filled 2021-04-13: qty 40

## 2021-04-13 MED ORDER — PROPOFOL 500 MG/50ML IV EMUL
INTRAVENOUS | Status: AC
  Filled 2021-04-13: qty 500

## 2021-04-13 MED ORDER — SODIUM CHLORIDE 0.9 % IV SOLN: INTRAVENOUS | Status: DC

## 2021-04-13 NOTE — Anesthesia Procedure Notes (Signed)
Procedure Name: General with mask airway Date/Time: 04/13/2021 8:39 AM Performed by: Kelton Pillar, CRNA Pre-anesthesia Checklist: Patient identified, Emergency Drugs available, Suction available and Patient being monitored Patient Re-evaluated:Patient Re-evaluated prior to induction Oxygen Delivery Method: Simple face mask Induction Type: IV induction Placement Confirmation: positive ETCO2 and CO2 detector Dental Injury: Teeth and Oropharynx as per pre-operative assessment

## 2021-04-13 NOTE — Anesthesia Preprocedure Evaluation (Signed)
Anesthesia Evaluation  Patient identified by MRN, date of birth, ID band Patient awake    Reviewed: Allergy & Precautions, NPO status , Patient's Chart, lab work & pertinent test results  History of Anesthesia Complications Negative for: history of anesthetic complications  Airway Mallampati: I   Neck ROM: Full    Dental no notable dental hx.    Pulmonary asthma , former smoker (quit 2020),    Pulmonary exam normal breath sounds clear to auscultation       Cardiovascular Exercise Tolerance: Good negative cardio ROS Normal cardiovascular exam Rhythm:Regular Rate:Normal     Neuro/Psych PSYCHIATRIC DISORDERS Anxiety Depression negative neurological ROS     GI/Hepatic negative GI ROS,   Endo/Other  Obesity   Renal/GU negative Renal ROS     Musculoskeletal   Abdominal   Peds  Hematology negative hematology ROS (+)   Anesthesia Other Findings   Reproductive/Obstetrics Endometriosis                              Anesthesia Physical Anesthesia Plan  ASA: 2  Anesthesia Plan: General   Post-op Pain Management:    Induction: Intravenous  PONV Risk Score and Plan: Propofol infusion, TIVA and Treatment may vary due to age or medical condition  Airway Management Planned: Natural Airway  Additional Equipment:   Intra-op Plan:   Post-operative Plan:   Informed Consent: I have reviewed the patients History and Physical, chart, labs and discussed the procedure including the risks, benefits and alternatives for the proposed anesthesia with the patient or authorized representative who has indicated his/her understanding and acceptance.       Plan Discussed with: CRNA  Anesthesia Plan Comments:         Anesthesia Quick Evaluation

## 2021-04-13 NOTE — H&P (Signed)
Alisha Antigua, MD [address] [place], [place], [place], [address] [address], [place], [place], [place], [address] Phone: [phone]  Fax: [phone]  Primary Care Physician:  Virginia Crews, MD   Pre-Procedure History & Physical: HPI:  Alisha Miller is a 45 y.o. female is here for a colonoscopy.   Past Medical History:  Diagnosis Date   Allergy    Anxiety    Asthma    Depression    Endometriosis    HSV (herpes simplex virus) infection    Rosacea     Past Surgical History:  Procedure Laterality Date   DILATION AND CURETTAGE OF UTERUS     LAPAROSCOPY      Prior to Admission medications   Medication Sig Start Date End Date Taking? Authorizing Provider  buPROPion (WELLBUTRIN XL) 300 MG 24 hr tablet Take 1 tablet (300 mg total) by mouth daily. 03/26/21  Yes Virginia Crews, MD  doxycycline (VIBRAMYCIN) 50 MG capsule  01/19/19  Yes [provider]  fluticasone (FLONASE) 50 MCG/ACT nasal spray Place 2 sprays into both nostrils daily. 02/02/20  Yes [name], PA-C  levonorgestrel (MIRENA) 20 MCG/24HR IUD by Intrauterine route.   Yes [provider]  loratadine (CLARITIN) 10 MG tablet Take 10 mg by mouth daily.   Yes [provider]  Turmeric (QC TUMERIC COMPLEX PO) Take by mouth.   Yes [provider]  valACYclovir (VALTREX) 500 MG tablet Take 1 tablet (500 mg total) by mouth 2 (two) times daily. 01/05/21  Yes Virginia Crews, MD  SULFACLEANSE 8/4 8-4 % SUSP  07/14/18   [provider]    Allergies as of 02/26/2021 - Review Complete 02/26/2021  Allergen Reaction Noted   Erythromycin Other (See Comments) 10/30/2015    Family History  Problem Relation Age of Onset   Breast cancer Paternal 60    Cancer Paternal Aunt    Diabetes Paternal 63    Breast cancer Maternal Grandmother    Dementia Maternal Grandmother    Cervical cancer Paternal Grandmother    Cancer Paternal Grandmother     Alzheimer's disease Mother    Diabetes Mother    Heart failure Mother    Arthritis Mother    Diabetes Father    Hypertension Father    Hyperlipidemia Father    Atrial fibrillation Father    Heart disease Father    Prostate cancer Father    Heart disease Paternal Uncle    Hypertension Paternal Uncle    Heart attack Maternal Grandfather    Diabetes Maternal Grandfather    Hypertension Maternal Grandfather    Heart attack Paternal Grandfather    Alcohol abuse Paternal Grandfather     Social History   Socioeconomic History   Marital status: Married    Spouse name: Not on file   Number of children: 0   Years of education: Not on file   Highest education level: Not on file  Occupational History   Occupation: [place] [employer] services  Tobacco Use   Smoking status: Former    Types: Cigarettes    Quit date: 08/30/2018    Years since quitting: 2.6   Smokeless tobacco: Never  Vaping Use   Vaping Use: Never used  Substance and Sexual Activity   Alcohol use: Yes    Alcohol/week: 13.0 standard drinks    Types: 7 Glasses of wine, 6 Cans of beer per week   Drug use: Never   Sexual activity: Yes  Partners: Male    Birth control/protection: I.U.D.  Other Topics Concern   Not on file  Social History Narrative   Not on file   Social Determinants of Health   Financial Resource Strain: Not on file  Food Insecurity: Not on file  Transportation Needs: Not on file  Physical Activity: Not on file  Stress: Not on file  Social Connections: Not on file  Intimate Partner Violence: Not on file    Review of Systems: See HPI, otherwise negative ROS  Physical Exam: Constitutional: General:   Alert,  Well-developed, well-nourished, pleasant and cooperative in NAD BP 108/88   Pulse 89   Temp (!) 96.8 F (36 C) (Temporal)   Resp 18   Ht 5\' 2"  (1.575 m)   Wt 85 kg   SpO2 100%   BMI 34.28 kg/m   Head: Normocephalic, atraumatic.   Eyes:  Sclera clear, no icterus.    Conjunctiva pink.   Mouth:  No deformity or lesions, oropharynx pink & moist.  Neck:  Supple, trachea midline  Respiratory: Normal respiratory effort  Gastrointestinal:  Soft, non-tender and non-distended without masses, hepatosplenomegaly or hernias noted.  No guarding or rebound tenderness.     Cardiac: No clubbing or edema.  No cyanosis. Normal posterior tibial pedal pulses noted.  Lymphatic:  No significant cervical adenopathy.  Psych:  Alert and cooperative. Normal mood and affect.  Musculoskeletal:   Symmetrical without gross deformities. 5/5 Lower extremity strength bilaterally.  Skin: Warm. Intact without significant lesions or rashes. No jaundice.  Neurologic:  Face symmetrical, tongue midline, Normal sensation to touch;  grossly normal neurologically.  Psych:  Alert and oriented x3, Alert and cooperative. Normal mood and affect.  Impression/Plan: [name] is here for a colonoscopy to be performed for average risk screening.  Risks, benefits, limitations, and alternatives regarding  colonoscopy have been reviewed with the patient.  Questions have been answered.  All parties agreeable.   Alisha Manifold, MD  04/13/2021, 8:23 AM

## 2021-04-13 NOTE — Anesthesia Postprocedure Evaluation (Signed)
Anesthesia Post Note  Patient: Alisha Miller  Procedure(s) Performed: COLONOSCOPY WITH PROPOFOL  Patient location during evaluation: PACU Anesthesia Type: General Level of consciousness: awake and alert, oriented and patient cooperative Pain management: pain level controlled Vital Signs Assessment: post-procedure vital signs reviewed and stable Respiratory status: spontaneous breathing, nonlabored ventilation and respiratory function stable Cardiovascular status: blood pressure returned to baseline and stable Postop Assessment: adequate PO intake Anesthetic complications: no   No notable events documented.   Last Vitals:  Vitals:   04/13/21 0906 04/13/21 0916  BP: 111/80 125/76  Pulse: 92 86  Resp: 15 17  Temp:    SpO2: 100% 100%    Last Pain:  Vitals:   04/13/21 0916  TempSrc:   PainSc: 0-No pain                 Darrin Nipper

## 2021-04-13 NOTE — Op Note (Signed)
[facility] Patient Name: Alisha Miller Procedure Date: 04/13/2021 8:25 AM MRN: [id number] Account #: [id number] Date of Birth: 11/03/1975 Admit Type: Outpatient Age: 45 Room: [facility] ENDO ROOM 2 Gender: Female Note Status: Finalized Instrument Name: Jasper Riling [id number] Procedure:             Colonoscopy Indications:           Screening for colorectal malignant neoplasm Providers:             Rion Schnitzer B. Bonna Gains MD, MD Referring MD:          Dionne Bucy. Bacigalupo (Referring MD) Medicines:             Monitored Anesthesia Care Complications:         No immediate complications. Procedure:             Pre-Anesthesia Assessment:                        - ASA Grade Assessment: II - A patient with mild                         systemic disease.                        - Prior to the procedure, a History and Physical was                         performed, and patient medications, allergies and                         sensitivities were reviewed. The patient's tolerance                         of previous anesthesia was reviewed.                        - The risks and benefits of the procedure and the                         sedation options and risks were discussed with the                         patient. All questions were answered and informed                         consent was obtained.                        - Patient identification and proposed procedure were                         verified prior to the procedure by the physician, the                         nurse, the anesthesiologist, the anesthetist and the                         technician. The procedure was verified in the  procedure room.                        After obtaining informed consent, the colonoscope was                         passed under direct vision. Throughout the procedure,                         the patient's blood pressure, pulse, and oxygen                          saturations were monitored continuously. The                         Colonoscope was introduced through the anus and                         advanced to the the cecum, identified by appendiceal                         orifice and ileocecal valve. The colonoscopy was                         performed with ease. The patient tolerated the                         procedure well. The quality of the bowel preparation                         was fair. Findings:      The perianal and digital rectal examinations were normal.      Two sessile polyps were found in the sigmoid colon. The polyps were 4 to       5 mm in size. These polyps were removed with a cold snare. Resection and       retrieval were complete.      Multiple diverticula were found in the sigmoid colon.      The exam was otherwise without abnormality.      The rectum, sigmoid colon, descending colon, transverse colon, ascending       colon and cecum appeared normal.      The retroflexed view of the distal rectum and anal verge was normal and       showed no anal or rectal abnormalities. Impression:            - Preparation of the colon was fair.                        - Two 4 to 5 mm polyps in the sigmoid colon, removed                         with a cold snare. Resected and retrieved.                        - Diverticulosis in the sigmoid colon.                        - The examination was otherwise normal.                        -  The rectum, sigmoid colon, descending colon,                         transverse colon, ascending colon and cecum are normal.                        - The distal rectum and anal verge are normal on                         retroflexion view. Recommendation:        - Discharge patient to home (with escort).                        - High fiber diet.                        - Advance diet as tolerated.                        - Continue present medications.                        - Await  pathology results.                        - Repeat colonoscopy date to be determined after                         pending pathology results are reviewed.                        - The findings and recommendations were discussed with                         the patient.                        - The findings and recommendations were discussed with                         the patient's family.                        - Return to primary care physician as previously                         scheduled. Procedure Code(s):     --- Professional ---                        [id number], Colonoscopy, flexible; with removal of                         tumor(s), polyp(s), or other lesion(s) by snare                         technique Diagnosis Code(s):     --- Professional ---                        Z12.11, Encounter for screening for malignant neoplasm  of colon                        K63.5, Polyp of colon CPT copyright 2019 American Medical Association. All rights reserved. The codes documented in this report are preliminary and upon coder review may  be revised to meet current compliance requirements.  Vonda Antigua, MD Margretta Sidle B. Bonna Gains MD, MD 04/13/2021 9:00:53 AM This report has been signed electronically. Number of Addenda: 0 Note Initiated On: 04/13/2021 8:25 AM Scope Withdrawal Time: 0 hours 15 minutes 2 seconds  Total Procedure Duration: 0 hours 17 minutes 53 seconds  Estimated Blood Loss:  Estimated blood loss: none.      [facility]

## 2021-04-13 NOTE — Transfer of Care (Signed)
Immediate Anesthesia Transfer of Care Note  Patient: Alisha Miller  Procedure(s) Performed: COLONOSCOPY WITH PROPOFOL  Patient Location: PACU  Anesthesia Type:General  Level of Consciousness: drowsy and patient cooperative  Airway & Oxygen Therapy: Patient Spontanous Breathing and Patient connected to face mask oxygen  Post-op Assessment: Report given to RN and Post -op Vital signs reviewed and stable  Post vital signs: Reviewed and stable  Last Vitals:  Vitals Value Taken Time  BP 98/64 04/13/21 0856  Temp 35.7 C 04/13/21 0856  Pulse 78 04/13/21 0859  Resp 12 04/13/21 0859  SpO2 100 % 04/13/21 0859  Vitals shown include unvalidated device data.  Last Pain:  Vitals:   04/13/21 0856  TempSrc: Temporal  PainSc: Asleep         Complications: No notable events documented.

## 2021-04-16 ENCOUNTER — Encounter: Payer: Self-pay | Admitting: Gastroenterology

## 2021-04-16 LAB — SURGICAL PATHOLOGY

## 2021-04-17 ENCOUNTER — Telehealth: Payer: Self-pay | Admitting: Gastroenterology

## 2021-04-17 NOTE — Telephone Encounter (Signed)
Pt. Has questions about test results she is requesting a call back

## 2021-04-18 NOTE — Telephone Encounter (Signed)
Pt is aware as instructed and expressed understanding 

## 2021-05-09 ENCOUNTER — Encounter: Payer: Self-pay | Admitting: Family Medicine

## 2021-05-12 LAB — HIV ANTIBODY (ROUTINE TESTING W REFLEX): HIV Screen 4th Generation wRfx: NONREACTIVE

## 2021-05-12 LAB — CBC WITH DIFFERENTIAL/PLATELET
Basophils Absolute: 0 10*3/uL (ref 0.0–0.2)
Basos: 0 %
EOS (ABSOLUTE): 0.1 10*3/uL (ref 0.0–0.4)
Eos: 1 %
Hematocrit: 40.8 % (ref 34.0–46.6)
Hemoglobin: 13.7 g/dL (ref 11.1–15.9)
Immature Grans (Abs): 0 10*3/uL (ref 0.0–0.1)
Immature Granulocytes: 1 %
Lymphocytes Absolute: 1.3 10*3/uL (ref 0.7–3.1)
Lymphs: 36 %
MCH: 29.5 pg (ref 26.6–33.0)
MCHC: 33.6 g/dL (ref 31.5–35.7)
MCV: 88 fL (ref 79–97)
Monocytes Absolute: 0.4 10*3/uL (ref 0.1–0.9)
Monocytes: 10 %
Neutrophils Absolute: 1.9 10*3/uL (ref 1.4–7.0)
Neutrophils: 52 %
Platelets: 272 10*3/uL (ref 150–450)
RBC: 4.65 x10E6/uL (ref 3.77–5.28)
RDW: 12.9 % (ref 11.7–15.4)
WBC: 3.6 10*3/uL (ref 3.4–10.8)

## 2021-05-12 LAB — COMPREHENSIVE METABOLIC PANEL
ALT: 22 IU/L (ref 0–32)
AST: 21 IU/L (ref 0–40)
Albumin/Globulin Ratio: 1.9 (ref 1.2–2.2)
Albumin: 4.4 g/dL (ref 3.8–4.8)
Alkaline Phosphatase: 59 IU/L (ref 44–121)
BUN/Creatinine Ratio: 13 (ref 9–23)
BUN: 8 mg/dL (ref 6–24)
Bilirubin Total: 0.2 mg/dL (ref 0.0–1.2)
CO2: 23 mmol/L (ref 20–29)
Calcium: 8.8 mg/dL (ref 8.7–10.2)
Chloride: 105 mmol/L (ref 96–106)
Creatinine, Ser: 0.62 mg/dL (ref 0.57–1.00)
Globulin, Total: 2.3 g/dL (ref 1.5–4.5)
Glucose: 93 mg/dL (ref 70–99)
Potassium: 4.3 mmol/L (ref 3.5–5.2)
Sodium: 140 mmol/L (ref 134–144)
Total Protein: 6.7 g/dL (ref 6.0–8.5)
eGFR: 112 mL/min/{1.73_m2} (ref >59)

## 2021-05-12 LAB — LIPID PANEL WITH LDL/HDL RATIO
Cholesterol, Total: 173 mg/dL (ref 100–199)
HDL: 56 mg/dL (ref >39)
LDL Chol Calc (NIH): 103 mg/dL — ABNORMAL HIGH (ref 0–99)
LDL/HDL Ratio: 1.8 ratio (ref 0.0–3.2)
Triglycerides: 74 mg/dL (ref 0–149)
VLDL Cholesterol Cal: 14 mg/dL (ref 5–40)

## 2021-05-12 LAB — HEMOGLOBIN A1C
Est. average glucose Bld gHb Est-mCnc: 111 mg/dL
Hgb A1c MFr Bld: 5.5 % (ref 4.8–5.6)

## 2021-05-12 LAB — FSH/LH
FSH: 2.7 m[IU]/mL
LH: 5.6 m[IU]/mL

## 2021-05-12 LAB — HEPATITIS C ANTIBODY: Hep C Virus Ab: <0.1 s/co ratio (ref 0.0–0.9)

## 2021-05-17 ENCOUNTER — Encounter: Payer: Self-pay | Admitting: Family Medicine

## 2022-02-07 NOTE — Progress Notes (Signed)
I,Meriah Shands Robinson,acting as a Education administrator for [employer], PA-C.,have documented all relevant documentation on the behalf of [name], PA-C,as directed by  [employer], PA-C while in the presence of [employer], PA-C.    Complete physical exam   Patient: Alisha Miller   DOB: 09-Nov-1975   46 y.o. Female  MRN: [id number] Visit Date: 02/08/2022  Today's healthcare provider: [name], PA-C   Chief Complaint  Patient presents with   Annual Exam   Subjective    HANNI MILFORD is a 46 y.o. female who presents today for a complete physical exam.  She reports consuming a general diet. The patient does not participate in regular exercise at present. She generally feels well. She reports sleeping well. She does not have additional problems to discuss today.    Past Medical History:  Diagnosis Date   Allergy    Anxiety    Asthma    Depression    Endometriosis    HSV (herpes simplex virus) infection    Rosacea    Past Surgical History:  Procedure Laterality Date   COLONOSCOPY WITH PROPOFOL N/A 04/13/2021   Procedure: COLONOSCOPY WITH PROPOFOL;  Surgeon: [name], MD;  Location: [facility] ENDOSCOPY;  Service: Endoscopy;  Laterality: N/A;   DILATION AND CURETTAGE OF UTERUS     LAPAROSCOPY     Social History   Socioeconomic History   Marital status: Married    Spouse name: Not on file   Number of children: 0   Years of education: Not on file   Highest education level: Not on file  Occupational History   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  Other Topics Concern   Not on file  Social History Narrative   Not on file   Social Determinants of Health   Financial Resource Strain: Not on file  Food Insecurity: Not on file  Transportation Needs: Not on file  Physical Activity: Not on file  Stress: Not on file  Social Connections: Not on file  Intimate Partner Violence: Not on file   Family Status  Relation Name Status   Field seismologist  (Not Specified)   [name]  Deceased   [name]  Deceased   Mother  Deceased   Father  Alive   [name]  (Not Specified)   [name]  Deceased   [name]  Deceased   Family History  Problem Relation Age of Onset   Breast cancer Paternal Aunt    Cancer Paternal Aunt    Diabetes Paternal 71    Breast cancer Maternal Grandmother    Dementia Maternal Grandmother    Cervical cancer Paternal Grandmother    Cancer Paternal 43    Alzheimer's disease Mother    Diabetes Mother    Heart failure Mother    Arthritis Mother    Diabetes Father    Hypertension Father    Hyperlipidemia Father    Atrial fibrillation Father  Heart disease Father    Prostate cancer Father    Heart disease Paternal Uncle    Hypertension Paternal Uncle    Heart attack Maternal Grandfather    Diabetes Maternal Grandfather    Hypertension Maternal Grandfather    Heart attack Paternal Grandfather    Alcohol abuse Paternal Grandfather    Allergies  Allergen Reactions   Erythromycin Other (See Comments)    Stomach cramps    Patient Care Team: Virginia Crews, MD as PCP - General (Family Medicine)   Medications: Outpatient Medications Prior to Visit  Medication Sig   buPROPion (WELLBUTRIN XL) 300 MG 24 hr tablet Take 1 tablet (300 mg total) by mouth daily.   fluticasone (FLONASE) 50 MCG/ACT nasal spray Place 2 sprays into both nostrils daily.   levonorgestrel (MIRENA) 20 MCG/24HR IUD by Intrauterine route.   loratadine (CLARITIN) 10 MG tablet Take 10 mg by mouth daily.   minocycline  (MINOCIN) 50 MG capsule    SULFACLEANSE 8/4 8-4 % SUSP    valACYclovir (VALTREX) 500 MG tablet Take 1 tablet (500 mg total) by mouth 2 (two) times daily.   doxycycline (VIBRAMYCIN) 50 MG capsule    Turmeric (QC TUMERIC COMPLEX PO) Take by mouth. (Patient not taking: Reported on 02/08/2022)   No facility-administered medications prior to visit.    Review of Systems  Respiratory: Negative.    Cardiovascular: Negative.   Gastrointestinal: Negative.   Musculoskeletal:  Positive for back pain and myalgias.       Due to foot fracture   All other systems reviewed and are negative.     Objective     BP (!) 126/90 (BP Location: Right Arm, Patient Position: Sitting, Cuff Size: Normal)   Pulse 79   Temp 98.8 F (37.1 C) (Oral)   Resp 16   Wt 194 lb (88 kg)   SpO2 100%   BMI 35.48 kg/m     Physical Exam Vitals reviewed.  Constitutional:      General: She is not in acute distress.    Appearance: Normal appearance. She is well-developed. She is not diaphoretic.  HENT:     Head: Normocephalic and atraumatic.     Right Ear: Tympanic membrane, ear canal and external ear normal.     Left Ear: Tympanic membrane, ear canal and external ear normal.     Nose: Nose normal.     Mouth/Throat:     Mouth: Mucous membranes are moist.     Pharynx: Oropharynx is clear. No oropharyngeal exudate.  Eyes:     General: No scleral icterus.    Conjunctiva/sclera: Conjunctivae normal.     Pupils: Pupils are equal, round, and reactive to light.  Neck:     Thyroid: No thyromegaly.  Cardiovascular:     Rate and Rhythm: Normal rate and regular rhythm.     Pulses: Normal pulses.     Heart sounds: Normal heart sounds. No murmur heard. Pulmonary:     Effort: Pulmonary effort is normal. No respiratory distress.     Breath sounds: Normal breath sounds. No wheezing or rales.  Abdominal:     General: There is no distension.     Palpations: Abdomen is soft.     Tenderness: There is no abdominal  tenderness.  Musculoskeletal:        General: No deformity.     Cervical back: Neck supple.     Right lower leg: No edema.     Left lower leg: No edema.  Lymphadenopathy:  Cervical: No cervical adenopathy.  Skin:    General: Skin is warm and dry.     Findings: No rash.  Neurological:     Mental Status: She is alert and oriented to person, place, and time. Mental status is at baseline.     Sensory: No sensory deficit.     Motor: No weakness.     Gait: Gait normal.  Psychiatric:        Mood and Affect: Mood normal.        Behavior: Behavior normal.        Thought Content: Thought content normal.     Right leg in "walking boot."  Last depression screening scores    02/08/2022   10:02 AM 02/02/2021   10:48 AM 05/11/2020    1:14 PM  PHQ 2/9 Scores  PHQ - 2 Score '2 3 1  '$ PHQ- 9 Score '3 5 1   '$ Last fall risk screening    02/08/2022   10:02 AM  Fall Risk   Falls in the past year? 1  Number falls in past yr: 0  Injury with Fall? 1  Follow up Falls evaluation completed   Last Audit-C alcohol use screening    02/08/2022   10:03 AM  Alcohol Use Disorder Test (AUDIT)  1. How often do you have a drink containing alcohol? 4  2. How many drinks containing alcohol do you have on a typical day when you are drinking? 0  3. How often do you have six or more drinks on one occasion? 1  AUDIT-C Score 5   A score of 3 or more in women, and 4 or more in men indicates increased risk for alcohol abuse, EXCEPT if all of the points are from question 1   No results found for any visits on 02/08/22.  Assessment & Plan    Routine Health Maintenance and Physical Exam  Exercise Activities and Dietary recommendations  Healthy and daily exercise/PT /swimming  Immunization History  Administered Date(s) Administered   Moderna Sars-Covid-2 Vaccination 03/15/2020, 04/12/2020   Td 03/22/1998, 02/02/2021   Tdap 09/27/2010    Health Maintenance  Topic Date Due   COVID-19 Vaccine (3 -  Moderna series) 06/07/2020   INFLUENZA VACCINE  01/29/2022   MAMMOGRAM  02/22/2022   COLONOSCOPY (Pts 45-55yr Insurance coverage will need to be confirmed)  04/13/2022   PAP SMEAR-Modifier  02/01/2025   TETANUS/TDAP  02/03/2031   Hepatitis C Screening  Completed   HIV Screening  Completed   HPV VACCINES  Aged Out    Discussed health benefits of physical activity, and encouraged her to engage in regular exercise appropriate for her age and condition.      1. Family history of diabetes mellitus  - Hemoglobin A1c  2. Class 1 obesity without serious comorbidity with body mass index (BMI) of 34.0 to 34.9 in adult, unspecified obesity type  - Comprehensive metabolic panel - CBC with Differential/Platelet - Lipid panel - Hemoglobin A1c  3. Colon cancer screening  - Ambulatory referral to Gastroenterology  4. Anemia, unspecified type  - Ambulatory referral to Gastroenterology - CBC with Differential/Platelet  5. Encounter for screening breast examination  - MM DIGITAL SCREENING BILATERAL; Future  The patient was advised to call back or seek an in-person evaluation if the symptoms worsen or if the condition fails to improve as anticipated.  I discussed the assessment and treatment plan with the patient. The patient was provided an opportunity to ask questions and all were answered. The  patient agreed with the plan and demonstrated an understanding of the instructions.  The entirety of the information documented in the History of Present Illness, Review of Systems and Physical Exam were personally obtained by me. Portions of this information were initially documented by the CMA and reviewed by me for thoroughness and accuracy.  Portions of this note were created using dictation software and may contain typographical errors.    [name], PA-C  [facility] [phone] (phone) [phone] (fax)  [place]

## 2022-02-08 ENCOUNTER — Encounter: Payer: Self-pay | Admitting: Physician Assistant

## 2022-02-08 ENCOUNTER — Ambulatory Visit (INDEPENDENT_AMBULATORY_CARE_PROVIDER_SITE_OTHER): Payer: BC Managed Care – PPO | Admitting: Physician Assistant

## 2022-02-08 VITALS — BP 126/90 | HR 79 | Temp 98.8°F | Resp 16 | Wt 194.0 lb

## 2022-02-08 DIAGNOSIS — E669 Obesity, unspecified: Secondary | ICD-10-CM | POA: Diagnosis not present

## 2022-02-08 DIAGNOSIS — Z1211 Encounter for screening for malignant neoplasm of colon: Secondary | ICD-10-CM | POA: Diagnosis not present

## 2022-02-08 DIAGNOSIS — Z833 Family history of diabetes mellitus: Secondary | ICD-10-CM | POA: Diagnosis not present

## 2022-02-08 DIAGNOSIS — Z1239 Encounter for other screening for malignant neoplasm of breast: Secondary | ICD-10-CM

## 2022-02-08 DIAGNOSIS — Z6834 Body mass index (BMI) 34.0-34.9, adult: Secondary | ICD-10-CM

## 2022-02-08 DIAGNOSIS — D649 Anemia, unspecified: Secondary | ICD-10-CM

## 2022-02-09 LAB — CBC WITH DIFFERENTIAL/PLATELET
Basophils Absolute: 0 10*3/uL (ref 0.0–0.2)
Basos: 0 %
EOS (ABSOLUTE): 0.1 10*3/uL (ref 0.0–0.4)
Eos: 1 %
Hematocrit: 41.5 % (ref 34.0–46.6)
Hemoglobin: 13.6 g/dL (ref 11.1–15.9)
Immature Grans (Abs): 0 10*3/uL (ref 0.0–0.1)
Immature Granulocytes: 0 %
Lymphocytes Absolute: 1.4 10*3/uL (ref 0.7–3.1)
Lymphs: 30 %
MCH: 28.6 pg (ref 26.6–33.0)
MCHC: 32.8 g/dL (ref 31.5–35.7)
MCV: 87 fL (ref 79–97)
Monocytes Absolute: 0.4 10*3/uL (ref 0.1–0.9)
Monocytes: 9 %
Neutrophils Absolute: 2.7 10*3/uL (ref 1.4–7.0)
Neutrophils: 60 %
Platelets: 299 10*3/uL (ref 150–450)
RBC: 4.76 x10E6/uL (ref 3.77–5.28)
RDW: 12.7 % (ref 11.7–15.4)
WBC: 4.6 10*3/uL (ref 3.4–10.8)

## 2022-02-09 LAB — COMPREHENSIVE METABOLIC PANEL
ALT: 34 IU/L — ABNORMAL HIGH (ref 0–32)
AST: 20 IU/L (ref 0–40)
Albumin/Globulin Ratio: 2.4 — ABNORMAL HIGH (ref 1.2–2.2)
Albumin: 4.5 g/dL (ref 3.9–4.9)
Alkaline Phosphatase: 59 IU/L (ref 44–121)
BUN/Creatinine Ratio: 16 (ref 9–23)
BUN: 11 mg/dL (ref 6–24)
Bilirubin Total: 0.3 mg/dL (ref 0.0–1.2)
CO2: 22 mmol/L (ref 20–29)
Calcium: 9.4 mg/dL (ref 8.7–10.2)
Chloride: 101 mmol/L (ref 96–106)
Creatinine, Ser: 0.67 mg/dL (ref 0.57–1.00)
Globulin, Total: 1.9 g/dL (ref 1.5–4.5)
Glucose: 96 mg/dL (ref 70–99)
Potassium: 4.6 mmol/L (ref 3.5–5.2)
Sodium: 141 mmol/L (ref 134–144)
Total Protein: 6.4 g/dL (ref 6.0–8.5)
eGFR: 109 mL/min/{1.73_m2} (ref >59)

## 2022-02-09 LAB — LIPID PANEL
Chol/HDL Ratio: 3.2 ratio (ref 0.0–4.4)
Cholesterol, Total: 161 mg/dL (ref 100–199)
HDL: 50 mg/dL (ref >39)
LDL Chol Calc (NIH): 103 mg/dL — ABNORMAL HIGH (ref 0–99)
Triglycerides: 37 mg/dL (ref 0–149)
VLDL Cholesterol Cal: 8 mg/dL (ref 5–40)

## 2022-02-09 LAB — HEMOGLOBIN A1C
Est. average glucose Bld gHb Est-mCnc: 108 mg/dL
Hgb A1c MFr Bld: 5.4 % (ref 4.8–5.6)

## 2022-02-10 NOTE — Progress Notes (Signed)
Hello Alisha Miller ,   Your labwork results all are within normal limits or stable for you  Any questions please reach out to the office or message me on MyChart!  Best, [name], PA-C

## 2022-02-11 ENCOUNTER — Other Ambulatory Visit: Payer: Self-pay

## 2022-02-11 ENCOUNTER — Telehealth: Payer: Self-pay

## 2022-02-11 DIAGNOSIS — Z86010 Personal history of colonic polyps: Secondary | ICD-10-CM

## 2022-02-11 MED ORDER — NA SULFATE-K SULFATE-MG SULF 17.5-3.13-1.6 GM/177ML PO SOLN: ORAL | 0 refills | Status: DC

## 2022-02-11 NOTE — Telephone Encounter (Signed)
Gastroenterology Pre-Procedure Review  Request Date: 04/19/22 Requesting Physician: Dr. Marius Ditch  PATIENT REVIEW QUESTIONS: The patient responded to the following health history questions as indicated:    1. Are you having any GI issues? no 2. Do you have a personal history of Polyps? yes (04/13/21 colonoscopy performed by Dr. Bonna Gains polyps were present.  Dr. Bonna Gains recommended a 2 day prep) 3. Do you have a family history of Colon Cancer or Polyps? yes (1st cousin colon cancer) 4. Diabetes Mellitus? no 5. Joint replacements in the past 12 months?no 6. Major health problems in the past 3 months?no 7. Any artificial heart valves, MVP, or defibrillator?no    MEDICATIONS & ALLERGIES:    Patient reports the following regarding taking any anticoagulation/antiplatelet therapy:   Plavix, Coumadin, Eliquis, Xarelto, Lovenox, Pradaxa, Brilinta, or Effient? no Aspirin? no  Patient confirms/reports the following medications:  Current Outpatient Medications  Medication Sig Dispense Refill   buPROPion (WELLBUTRIN XL) 300 MG 24 hr tablet Take 1 tablet (300 mg total) by mouth daily. 90 tablet 3   fluticasone (FLONASE) 50 MCG/ACT nasal spray Place 2 sprays into both nostrils daily. 16 g 3   levonorgestrel (MIRENA) 20 MCG/24HR IUD by Intrauterine route.     loratadine (CLARITIN) 10 MG tablet Take 10 mg by mouth daily.     minocycline (MINOCIN) 50 MG capsule      SULFACLEANSE 8/4 8-4 % SUSP      valACYclovir (VALTREX) 500 MG tablet Take 1 tablet (500 mg total) by mouth 2 (two) times daily. 12 tablet 2   No current facility-administered medications for this visit.    Patient confirms/reports the following allergies:  Allergies  Allergen Reactions   Erythromycin Other (See Comments)    Stomach cramps    No orders of the defined types were placed in this encounter.   AUTHORIZATION INFORMATION Primary Insurance: 1D#: Group #:  Secondary Insurance: 1D#: Group #:  SCHEDULE  INFORMATION: Date: 04/19/22 Time: Location: [facility]

## 2022-03-11 ENCOUNTER — Ambulatory Visit
Admission: RE | Admit: 2022-03-11 | Discharge: 2022-03-11 | Disposition: A | Discharge status: Home / Self Care | Payer: BC Managed Care – PPO | Source: Ambulatory Visit | Attending: Physician Assistant | Admitting: Physician Assistant

## 2022-03-11 DIAGNOSIS — Z1239 Encounter for other screening for malignant neoplasm of breast: Secondary | ICD-10-CM

## 2022-03-11 DIAGNOSIS — Z1231 Encounter for screening mammogram for malignant neoplasm of breast: Secondary | ICD-10-CM | POA: Insufficient documentation

## 2022-04-18 ENCOUNTER — Encounter: Payer: Self-pay | Admitting: Gastroenterology

## 2022-04-19 ENCOUNTER — Ambulatory Visit: Payer: BC Managed Care – PPO | Admitting: General Practice

## 2022-04-19 ENCOUNTER — Ambulatory Visit
Admission: RE | Admit: 2022-04-19 | Discharge: 2022-04-19 | Disposition: A | Discharge status: Home / Self Care | Payer: BC Managed Care – PPO | Attending: Gastroenterology | Admitting: Gastroenterology

## 2022-04-19 ENCOUNTER — Encounter
Admission: RE | Disposition: A | Discharge status: Home / Self Care | Payer: Self-pay | Source: Home / Self Care | Attending: Gastroenterology

## 2022-04-19 ENCOUNTER — Encounter: Payer: Self-pay | Admitting: Gastroenterology

## 2022-04-19 DIAGNOSIS — Z86010 Personal history of colonic polyps: Secondary | ICD-10-CM | POA: Insufficient documentation

## 2022-04-19 DIAGNOSIS — Z87891 Personal history of nicotine dependence: Secondary | ICD-10-CM | POA: Diagnosis not present

## 2022-04-19 DIAGNOSIS — E669 Obesity, unspecified: Secondary | ICD-10-CM | POA: Insufficient documentation

## 2022-04-19 DIAGNOSIS — Z1211 Encounter for screening for malignant neoplasm of colon: Secondary | ICD-10-CM | POA: Insufficient documentation

## 2022-04-19 DIAGNOSIS — Z6834 Body mass index (BMI) 34.0-34.9, adult: Secondary | ICD-10-CM | POA: Insufficient documentation

## 2022-04-19 DIAGNOSIS — J45909 Unspecified asthma, uncomplicated: Secondary | ICD-10-CM | POA: Diagnosis not present

## 2022-04-19 DIAGNOSIS — F32A Depression, unspecified: Secondary | ICD-10-CM | POA: Diagnosis not present

## 2022-04-19 HISTORY — PX: COLONOSCOPY WITH PROPOFOL: SHX5780

## 2022-04-19 LAB — POCT PREGNANCY, URINE: Preg Test, Ur: NEGATIVE

## 2022-04-19 SURGERY — COLONOSCOPY WITH PROPOFOL
Anesthesia: General

## 2022-04-19 MED ORDER — LIDOCAINE HCL (CARDIAC) PF 100 MG/5ML IV SOSY
PREFILLED_SYRINGE | INTRAVENOUS | Status: DC | PRN
  Administered 2022-04-19: 100 mg via INTRAVENOUS

## 2022-04-19 MED ORDER — PROPOFOL 10 MG/ML IV BOLUS
INTRAVENOUS | Status: DC | PRN
  Administered 2022-04-19: 130 ug/kg/min via INTRAVENOUS
  Administered 2022-04-19: 100 mg via INTRAVENOUS

## 2022-04-19 MED ORDER — SODIUM CHLORIDE 0.9 % IV SOLN
INTRAVENOUS | Status: DC
  Administered 2022-04-19: 20 mL/h via INTRAVENOUS

## 2022-04-19 MED ORDER — LIDOCAINE HCL (PF) 2 % IJ SOLN
INTRAMUSCULAR | Status: AC
  Filled 2022-04-19: qty 5

## 2022-04-19 NOTE — Transfer of Care (Signed)
Immediate Anesthesia Transfer of Care Note  Patient: Alisha Miller  Procedure(s) Performed: COLONOSCOPY WITH PROPOFOL  Patient Location: PACU  Anesthesia Type:General  Level of Consciousness: awake  Airway & Oxygen Therapy: Patient Spontanous Breathing  Post-op Assessment: Report given to RN and Post -op Vital signs reviewed and stable  Post vital signs: Reviewed and stable  Last Vitals:  Vitals Value Taken Time  BP 108/79 04/19/22 1014  Temp 35.7 C 04/19/22 1010  Pulse 74 04/19/22 1012  Resp 16 04/19/22 1014  SpO2 100 % 04/19/22 1012  Vitals shown include unvalidated device data.  Last Pain:  Vitals:   04/19/22 1010  TempSrc: Temporal  PainSc: Asleep         Complications: No notable events documented.

## 2022-04-19 NOTE — Op Note (Signed)
[facility] Gastroenterology Patient Name: Alisha Miller Procedure Date: 04/19/2022 9:42 AM MRN: [id number] Account #: [id number] Date of Birth: 1975/08/03 Admit Type: Outpatient Age: 46 Room: [facility] ROOM 2 Gender: Female Note Status: Finalized Instrument Name: Jasper Riling [id number] Procedure:             Colonoscopy Indications:           High risk colon cancer surveillance: Personal history                         of multiple (3 or more) adenomas Providers:             Lin Landsman MD, MD Medicines:             General Anesthesia Complications:         No immediate complications. Estimated blood loss: None. Procedure:             Pre-Anesthesia Assessment:                        - Prior to the procedure, a History and Physical was                         performed, and patient medications and allergies were                         reviewed. The patient is competent. The risks and                         benefits of the procedure and the sedation options and                         risks were discussed with the patient. All questions                         were answered and informed consent was obtained.                         Patient identification and proposed procedure were                         verified by the physician, the nurse, the                         anesthesiologist, the anesthetist and the technician                         in the pre-procedure area in the procedure room in the                         endoscopy suite. Mental Status Examination: alert and                         oriented. Airway Examination: normal oropharyngeal                         airway and neck mobility. Respiratory Examination:                         clear  to auscultation. CV Examination: normal.                         Prophylactic Antibiotics: The patient does not require                         prophylactic antibiotics. Prior Anticoagulants: The                          patient has taken no previous anticoagulant or                         antiplatelet agents. ASA Grade Assessment: II - A                         patient with mild systemic disease. After reviewing                         the risks and benefits, the patient was deemed in                         satisfactory condition to undergo the procedure. The                         anesthesia plan was to use general anesthesia.                         Immediately prior to administration of medications,                         the patient was re-assessed for adequacy to receive                         sedatives. The heart rate, respiratory rate, oxygen                         saturations, blood pressure, adequacy of pulmonary                         ventilation, and response to care were monitored                         throughout the procedure. The physical status of the                         patient was re-assessed after the procedure.                        After obtaining informed consent, the colonoscope was                         passed under direct vision. Throughout the procedure,                         the patient's blood pressure, pulse, and oxygen                         saturations were monitored continuously. The  Colonoscope was introduced through the anus and                         advanced to the the terminal ileum, with                         identification of the appendiceal orifice and IC                         valve. The colonoscopy was performed without                         difficulty. The patient tolerated the procedure well.                         The quality of the bowel preparation was evaluated                         using the BBPS [facility] Bowel Preparation Scale) with                         scores of: Right Colon = 3, Transverse Colon = 3 and                         Left Colon = 3 (entire mucosa seen well with no                          residual staining, small fragments of stool or opaque                         liquid). The total BBPS score equals 9. Findings:      The perianal and digital rectal examinations were normal. Pertinent       negatives include normal sphincter tone and no palpable rectal lesions.      The terminal ileum appeared normal.      The colon (entire examined portion) appeared normal.      The retroflexed view of the distal rectum and anal verge was normal and       showed no anal or rectal abnormalities. Impression:            - The examined portion of the ileum was normal.                        - The entire examined colon is normal.                        - The distal rectum and anal verge are normal on                         retroflexion view.                        - No specimens collected. Recommendation:        - Discharge patient to home (with escort).                        - Resume previous diet today.                        -  Continue present medications.                        - Repeat colonoscopy in 10 years for screening                         purposes. Procedure Code(s):     --- Professional ---                        N8676, Colorectal cancer screening; colonoscopy on                         individual at high risk Diagnosis Code(s):     --- Professional ---                        Z86.010, Personal history of colonic polyps CPT copyright 2019 American Medical Association. All rights reserved. The codes documented in this report are preliminary and upon coder review may  be revised to meet current compliance requirements. Dr. Ulyess Mort Lin Landsman MD, MD 04/19/2022 10:12:33 AM This report has been signed electronically. Number of Addenda: 0 Note Initiated On: 04/19/2022 9:42 AM Scope Withdrawal Time: 0 hours 9 minutes 52 seconds  Total Procedure Duration: 0 hours 11 minutes 7 seconds  Estimated Blood Loss:  Estimated blood loss: none.      [facility]

## 2022-04-19 NOTE — Anesthesia Preprocedure Evaluation (Signed)
Anesthesia Evaluation  Patient identified by MRN, date of birth, ID band Patient awake    Reviewed: Allergy & Precautions, NPO status , Patient's Chart, lab work & pertinent test results  History of Anesthesia Complications Negative for: history of anesthetic complications  Airway Mallampati: I  TM Distance: >3 FB Neck ROM: Full    Dental no notable dental hx. (+) Chipped   Pulmonary neg pulmonary ROS, asthma , former smoker,    Pulmonary exam normal breath sounds clear to auscultation       Cardiovascular Exercise Tolerance: Good negative cardio ROS Normal cardiovascular exam Rhythm:Regular Rate:Normal     Neuro/Psych PSYCHIATRIC DISORDERS Anxiety Depression negative neurological ROS  negative psych ROS   GI/Hepatic negative GI ROS, Neg liver ROS,   Endo/Other  negative endocrine ROSObesity   Renal/GU negative Renal ROS  negative genitourinary   Musculoskeletal   Abdominal   Peds  Hematology negative hematology ROS (+)   Anesthesia Other Findings Past Medical History: No date: Allergy No date: Anxiety No date: Asthma No date: Depression No date: Endometriosis No date: HSV (herpes simplex virus) infection No date: Rosacea  Past Surgical History: 04/13/2021: COLONOSCOPY WITH PROPOFOL; N/A     Comment:  Procedure: COLONOSCOPY WITH PROPOFOL;  Surgeon:               [name], MD;  Location: [facility] ENDOSCOPY;                Service: Endoscopy;  Laterality: N/A; No date: DILATION AND CURETTAGE OF UTERUS No date: LAPAROSCOPY  BMI    Body Mass Index: 34.75 kg/m      Reproductive/Obstetrics negative OB ROS Endometriosis                              Anesthesia Physical  Anesthesia Plan  ASA: 2  Anesthesia Plan: General   Post-op Pain Management: Minimal or no pain anticipated   Induction: Intravenous  PONV Risk Score and Plan: 3 and Propofol infusion, TIVA and  Treatment may vary due to age or medical condition  Airway Management Planned: Natural Airway  Additional Equipment: None  Intra-op Plan:   Post-operative Plan:   Informed Consent: I have reviewed the patients History and Physical, chart, labs and discussed the procedure including the risks, benefits and alternatives for the proposed anesthesia with the patient or authorized representative who has indicated his/her understanding and acceptance.     Dental advisory given  Plan Discussed with: CRNA  Anesthesia Plan Comments: (Discussed risks of anesthesia with patient, including possibility of difficulty with spontaneous ventilation under anesthesia necessitating airway intervention, PONV, and rare risks such as cardiac or respiratory or neurological events, and allergic reactions. Discussed the role of CRNA in patient's perioperative care. Patient understands.)        Anesthesia Quick Evaluation

## 2022-04-19 NOTE — H&P (Signed)
Alisha Darby, MD [address]  [place]  [place], [place] [address]  Main: [phone]  Fax: [phone] Pager: [phone]  Primary Care Physician:  Virginia Crews, MD Primary Gastroenterologist:  Dr. Cephas Miller  Pre-Procedure History & Physical: HPI:  Alisha Miller is a 46 y.o. female is here for an colonoscopy.   Past Medical History:  Diagnosis Date   Allergy    Anxiety    Asthma    Depression    Endometriosis    HSV (herpes simplex virus) infection    Rosacea     Past Surgical History:  Procedure Laterality Date   COLONOSCOPY WITH PROPOFOL N/A 04/13/2021   Procedure: COLONOSCOPY WITH PROPOFOL;  Surgeon: [name], MD;  Location: [facility] ENDOSCOPY;  Service: Endoscopy;  Laterality: N/A;   DILATION AND CURETTAGE OF UTERUS     LAPAROSCOPY      Prior to Admission medications   Medication Sig Start Date End Date Taking? Authorizing Provider  buPROPion (WELLBUTRIN XL) 300 MG 24 hr tablet Take 1 tablet (300 mg total) by mouth daily. 03/26/21  Yes Bacigalupo, Dionne Bucy, MD  fluticasone (FLONASE) 50 MCG/ACT nasal spray Place 2 sprays into both nostrils daily. 02/02/20  Yes [name], PA-C  levonorgestrel (MIRENA) 20 MCG/24HR IUD by Intrauterine route.   Yes [provider]  loratadine (CLARITIN) 10 MG tablet Take 10 mg by mouth daily.   Yes [provider]  minocycline (MINOCIN) 50 MG capsule  01/20/22  Yes [provider]  Na Sulfate-K Sulfate-Mg Sulf 17.5-3.13-1.6 GM/177ML SOLN Mix each bottle with clear liquid. Bottle #1 at 4pm on 18th, Bottle #2 at 10pm on the 18th, Bottle #3 5pm on 19th, Bottle #4 5 hours prior to colonoscopy. 02/11/22  Yes Lin Landsman, MD  SULFACLEANSE 8/4 8-4 % SUSP  07/14/18  Yes [provider]  valACYclovir (VALTREX) 500 MG tablet Take 1 tablet (500 mg total) by mouth 2 (two) times daily. 01/05/21  Yes Virginia Crews, MD    Allergies as of 02/11/2022 - Review Complete  02/08/2022  Allergen Reaction Noted   Erythromycin Other (See Comments) 10/30/2015    Family History  Problem Relation Age of Onset   Breast cancer Paternal 35    Cancer Paternal Aunt    Diabetes Paternal 39    Breast cancer Maternal Grandmother    Dementia Maternal Grandmother    Cervical cancer Paternal Grandmother    Cancer Paternal Grandmother    Alzheimer's disease Mother    Diabetes Mother    Heart failure Mother    Arthritis Mother    Diabetes Father    Hypertension Father    Hyperlipidemia Father    Atrial fibrillation Father    Heart disease Father    Prostate cancer Father    Heart disease Paternal Uncle    Hypertension Paternal Uncle    Heart attack Maternal Grandfather    Diabetes Maternal Grandfather    Hypertension Maternal Grandfather    Heart attack Paternal Grandfather    Alcohol abuse Paternal Grandfather     Social History   Socioeconomic History   Marital status: Married    Spouse name: Not on file   Number of children: 0   Years of education: Not on file   Highest education level: Not on file  Occupational History   Occupation: [employer]  Tobacco Use   Smoking status: Former    Types: Cigarettes    Quit date:  08/30/2018    Years since quitting: 3.6   Smokeless tobacco: Never  Vaping Use   Vaping Use: Never used  Substance and Sexual Activity   Alcohol use: Yes    Alcohol/week: 13.0 standard drinks of alcohol    Types: 7 Glasses of wine, 6 Cans of beer per week   Drug use: Never   Sexual activity: Yes    Partners: Male    Birth control/protection: I.U.D.  Other Topics Concern   Not on file  Social History Narrative   Not on file   Social Determinants of Health   Financial Resource Strain: Not on file  Food Insecurity: Not on file  Transportation Needs: Not on file  Physical Activity: Not on file  Stress: Not on file  Social Connections: Not on file  Intimate Partner Violence: Not on file     Review of Systems: See HPI, otherwise negative ROS  Physical Exam: BP 125/87   Pulse 77   Temp (!) 96.5 F (35.8 C) (Temporal)   Resp 20   Ht '5\' 2"'$  (1.575 m)   Wt 86.2 kg   SpO2 100%   BMI 34.75 kg/m  General:   Alert,  pleasant and cooperative in NAD Head:  Normocephalic and atraumatic. Neck:  Supple; no masses or thyromegaly. Lungs:  Clear throughout to auscultation.    Heart:  Regular rate and rhythm. Abdomen:  Soft, nontender and nondistended. Normal bowel sounds, without guarding, and without rebound.   Neurologic:  Alert and  oriented x4;  grossly normal neurologically.  Impression/Plan: Alisha Miller is here for an colonoscopy to be performed for h/o colon polyps and previous colonoscopy with fair prep  Risks, benefits, limitations, and alternatives regarding  colonoscopy have been reviewed with the patient.  Questions have been answered.  All parties agreeable.   Sherri Sear, MD  04/19/2022, 8:39 AM

## 2022-04-22 NOTE — Anesthesia Postprocedure Evaluation (Signed)
Anesthesia Post Note  Patient: Alisha Miller  Procedure(s) Performed: COLONOSCOPY WITH PROPOFOL  Patient location during evaluation: Endoscopy Anesthesia Type: General Level of consciousness: awake and alert Pain management: pain level controlled Vital Signs Assessment: post-procedure vital signs reviewed and stable Respiratory status: spontaneous breathing, nonlabored ventilation, respiratory function stable and patient connected to nasal cannula oxygen Cardiovascular status: blood pressure returned to baseline and stable Postop Assessment: no apparent nausea or vomiting Anesthetic complications: no   No notable events documented.   Last Vitals:  Vitals:   04/19/22 1020 04/19/22 1030  BP: (!) 124/92 128/89  Pulse: 73   Resp: 18 15  Temp:    SpO2: 100% 100%    Last Pain:  Vitals:   04/20/22 1010  TempSrc:   PainSc: 0-No pain                 Dimas Millin

## 2022-05-17 ENCOUNTER — Other Ambulatory Visit: Payer: Self-pay | Admitting: Family Medicine

## 2022-05-17 DIAGNOSIS — F411 Generalized anxiety disorder: Secondary | ICD-10-CM

## 2022-06-06 NOTE — Progress Notes (Addendum)
I,[name],acting as a scribe for Shirlee Latch, MD.,have documented all relevant documentation on the behalf of Shirlee Latch, MD,as directed by  Shirlee Latch, MD while in the presence of Shirlee Latch, MD.    Established patient visit   Patient: Alisha Miller   DOB: Jun 21, 1976   46 y.o. Female  MRN: [id number] Visit Date: 06/07/2022  Today's healthcare provider: Shirlee Latch, MD   Chief Complaint  Patient presents with   Anxiety   Subjective    HPI  Virtual Visit via Video Note  I connected with Alisha Miller on 12/05/22 at  9:00 AM EST by a video enabled telemedicine application and verified that I am speaking with the correct person using two identifiers.  Location:  Patient location: home Provider location: [facility] Persons involved in the visit: patient, provider    I discussed the limitations of evaluation and management by telemedicine and the availability of in person appointments. The patient expressed understanding and agreed to proceed.   Need for anxiety medications. Father passed away. Patient has increased (q2wks) counseling appointments but anxiety has increased. Would like to discuss small dose of med for anxiety.   Feeling on edge, but very aware and using the tools she has gathered from therapy.     06/07/2022    8:49 AM 02/08/2022   10:02 AM 02/02/2021   10:48 AM  Depression screen PHQ 2/9  Decreased Interest 2 1 2   Down, Depressed, Hopeless 1 1 1   PHQ - 2 Score 3 2 3   Altered sleeping 2 0 0  Tired, decreased energy 1 0 1  Change in appetite 2 0 0  Feeling bad or failure about yourself  0 1 1  Trouble concentrating 2 0 0  Moving slowly or fidgety/restless 2 0 0  Suicidal thoughts 0 0 0  PHQ-9 Score 12 3 5   Difficult doing work/chores Somewhat difficult Not difficult at all Somewhat difficult       06/07/2022    8:53 AM  GAD 7 : Generalized Anxiety Score  Nervous, Anxious, on Edge 3   Control/stop worrying 2  Worry too much - different things 2  Trouble relaxing 1  Restless 1  Easily annoyed or irritable 2  Afraid - awful might happen 0  Total GAD 7 Score 11  Anxiety Difficulty Somewhat difficult      Medications: Outpatient Medications Prior to Visit  Medication Sig   buPROPion (WELLBUTRIN XL) 300 MG 24 hr tablet TAKE 1 TABLET BY MOUTH EVERY DAY   fluticasone (FLONASE) 50 MCG/ACT nasal spray Place 2 sprays into both nostrils daily.   levonorgestrel (MIRENA) 20 MCG/24HR IUD by Intrauterine route.   loratadine (CLARITIN) 10 MG tablet Take 10 mg by mouth daily.   minocycline (MINOCIN) 50 MG capsule    SULFACLEANSE 8/4 8-4 % SUSP    valACYclovir (VALTREX) 500 MG tablet Take 1 tablet (500 mg total) by mouth 2 (two) times daily.   [DISCONTINUED] Na Sulfate-K Sulfate-Mg Sulf 17.5-3.13-1.6 GM/177ML SOLN Mix each bottle with clear liquid. Bottle #1 at 4pm on 18th, Bottle #2 at 10pm on the 18th, Bottle #3 5pm on 19th, Bottle #4 5 hours prior to colonoscopy.   No facility-administered medications prior to visit.    Review of Systems per HPI     Objective    Wt 194 lb 14.4 oz (88.4 kg) Comment: per patient this morning  BMI 35.65 kg/m    Physical Exam Constitutional:  General: She is not in acute distress.    Appearance: Normal appearance.  HENT:     Head: Normocephalic.  Pulmonary:     Effort: Pulmonary effort is normal. No respiratory distress.  Neurological:     Mental Status: She is alert and oriented to person, place, and time. Mental status is at baseline.  Psychiatric:        Mood and Affect: Mood is anxious.       No results found for any visits on 06/07/22.  Assessment & Plan    I discussed the assessment and treatment plan with the patient. The patient was provided an opportunity to ask questions and all were answered. The patient agreed with the plan and demonstrated an understanding of the instructions.   The patient was advised to  call back or seek an in-person evaluation if the symptoms worsen or if the condition fails to improve as anticipated. Problem List Items Addressed This Visit       Other   GAD (generalized anxiety disorder) - Primary    Chronic, was previously well controlled Exacerbated by recent passing of her father Continue wellbutrin and therapy Will Start zoloft 50 mg daily Discussed potential side effects, incl GI upset, sexual dysfunction, and SI Discussed that it can take 6-8 weeks to reach full efficacy In the interim, ok to use low dose xanax sparingly prn Contracted for safety - no SI/HI Discussed synergistic effects of medications and therapy       Relevant Medications   sertraline (ZOLOFT) 50 MG tablet   ALPRAZolam (XANAX) 0.5 MG tablet   Acute reaction to situational stress    Exacerbation in anxiety related to losing her father See plan above      Relevant Medications   sertraline (ZOLOFT) 50 MG tablet   ALPRAZolam (XANAX) 0.5 MG tablet     Return in about 6 weeks (around 07/19/2022) for GAD f/u, virtual ok.      I, Shirlee Latch, MD, have reviewed all documentation for this visit. The documentation on 06/07/22 for the exam, diagnosis, procedures, and orders are all accurate and complete.   Alisha Miller, Marzella Schlein, MD, MPH [facility]

## 2022-06-07 ENCOUNTER — Telehealth (INDEPENDENT_AMBULATORY_CARE_PROVIDER_SITE_OTHER): Payer: BC Managed Care – PPO | Admitting: Family Medicine

## 2022-06-07 VITALS — Wt 194.9 lb

## 2022-06-07 DIAGNOSIS — F411 Generalized anxiety disorder: Secondary | ICD-10-CM | POA: Diagnosis not present

## 2022-06-07 DIAGNOSIS — F430 Acute stress reaction: Secondary | ICD-10-CM | POA: Diagnosis not present

## 2022-06-07 MED ORDER — SERTRALINE HCL 50 MG PO TABS: 50.0000 mg | ORAL_TABLET | Freq: Every day | ORAL | 3 refills | Status: DC

## 2022-06-07 MED ORDER — ALPRAZOLAM 0.5 MG PO TABS: 0.2500 mg | ORAL_TABLET | Freq: Two times a day (BID) | ORAL | 1 refills | Status: DC | PRN

## 2022-06-07 NOTE — Assessment & Plan Note (Signed)
Exacerbation in anxiety related to losing her father See plan above

## 2022-06-07 NOTE — Assessment & Plan Note (Signed)
Chronic, was previously well controlled Exacerbated by recent passing of her father Continue wellbutrin and therapy Will Start zoloft 50 mg daily Discussed potential side effects, incl GI upset, sexual dysfunction, and SI Discussed that it can take 6-8 weeks to reach full efficacy In the interim, ok to use low dose xanax sparingly prn Contracted for safety - no SI/HI Discussed synergistic effects of medications and therapy

## 2022-06-18 ENCOUNTER — Encounter: Payer: Self-pay | Admitting: Family Medicine

## 2022-06-19 ENCOUNTER — Encounter: Payer: Self-pay | Admitting: Physician Assistant

## 2022-06-19 ENCOUNTER — Ambulatory Visit (INDEPENDENT_AMBULATORY_CARE_PROVIDER_SITE_OTHER): Payer: BC Managed Care – PPO | Admitting: Physician Assistant

## 2022-06-19 VITALS — BP 137/84 | HR 76 | Temp 98.5°F | Resp 16

## 2022-06-19 DIAGNOSIS — J019 Acute sinusitis, unspecified: Secondary | ICD-10-CM

## 2022-06-19 DIAGNOSIS — R059 Cough, unspecified: Secondary | ICD-10-CM

## 2022-06-19 DIAGNOSIS — R0989 Other specified symptoms and signs involving the circulatory and respiratory systems: Secondary | ICD-10-CM

## 2022-06-19 MED ORDER — ALBUTEROL SULFATE HFA 108 (90 BASE) MCG/ACT IN AERS: 2.0000 | INHALATION_SPRAY | Freq: Four times a day (QID) | RESPIRATORY_TRACT | 2 refills | Status: AC | PRN

## 2022-06-19 NOTE — Progress Notes (Signed)
I,Alisha Miller,acting as a Education administrator for [employer], PA-C.,have documented all relevant documentation on the behalf of Alisha Speak, PA-C,as directed by  [employer], PA-C while in the presence of [employer], PA-C.   Established patient visit   Patient: Alisha Miller   DOB: [birth year]   46 y.o. Female  MRN: [id number] Visit Date: 06/19/2022  Today's healthcare provider: [name], PA-C   Chief Complaint  Patient presents with   URI   Subjective    URI  This is a new problem. The current episode started 1 to 4 weeks ago. The problem has been gradually worsening. There has been no fever. Associated symptoms include congestion, coughing, headaches (Pressure), a plugged ear sensation and rhinorrhea. Pertinent negatives include no wheezing. Associated symptoms comments: Chest congestion. She has tried decongestant (Mucinex) for the symptoms.   Patient has tested negative for covid twice at home.   Medications: Outpatient Medications Prior to Visit  Medication Sig   ALPRAZolam (XANAX) 0.5 MG tablet Take 0.5-1 tablets (0.25-0.5 mg total) by mouth 2 (two) times daily as needed for anxiety.   buPROPion (WELLBUTRIN XL) 300 MG 24 hr tablet TAKE 1 TABLET BY MOUTH EVERY DAY   fluticasone (FLONASE) 50 MCG/ACT nasal spray Place 2 sprays into both nostrils daily.   levonorgestrel (MIRENA) 20 MCG/24HR IUD by Intrauterine route.   loratadine (CLARITIN) 10 MG tablet Take 10 mg by mouth daily.   minocycline (MINOCIN) 50 MG capsule    sertraline (ZOLOFT) 50 MG tablet Take 1 tablet (50 mg total) by mouth daily.   SULFACLEANSE 8/4 8-4 % SUSP    valACYclovir (VALTREX) 500 MG tablet Take 1 tablet (500 mg total) by mouth 2 (two) times daily.   No facility-administered medications prior to visit.    Review of Systems  HENT:  Positive for congestion, postnasal drip, rhinorrhea and sinus pressure.   Respiratory:  Positive for cough and chest tightness. Negative for shortness of breath  and wheezing.   Neurological:  Positive for headaches (Pressure).       Objective    BP 137/84 (BP Location: Right Arm, Patient Position: Sitting, Cuff Size: Large)   Pulse 76   Temp 98.5 F (36.9 C) (Oral)   Resp 16   SpO2 98%    Physical Exam Vitals reviewed.  Constitutional:      General: She is not in acute distress.    Appearance: Normal appearance. She is well-developed. She is not diaphoretic.  HENT:     Head: Normocephalic and atraumatic.     Nose: Congestion and rhinorrhea present.     Mouth/Throat:     Pharynx: Posterior oropharyngeal erythema present.  Eyes:     General: No scleral icterus.       Right eye: No discharge.        Left eye: No discharge.     Extraocular Movements: Extraocular movements intact.     Conjunctiva/sclera: Conjunctivae normal.     Pupils: Pupils are equal, round, and reactive to light.  Neck:     Thyroid: No thyromegaly.  Cardiovascular:     Rate and Rhythm: Normal rate and regular rhythm.     Pulses: Normal pulses.     Heart sounds: Normal heart sounds. No murmur heard. Pulmonary:     Effort: Pulmonary effort is normal. No respiratory distress.     Breath sounds: Rhonchi (bibasilar) present. No wheezing or rales.  Musculoskeletal:     Cervical back: Neck supple.  Right lower leg: No edema.     Left lower leg: No edema.  Lymphadenopathy:     Cervical: No cervical adenopathy.  Skin:    General: Skin is warm and dry.     Findings: No rash.  Neurological:     Mental Status: She is alert and oriented to person, place, and time. Mental status is at baseline.  Psychiatric:        Mood and Affect: Mood normal.        Behavior: Behavior normal.       No results found for any visits on 06/19/22.  Assessment & Plan     1. Abnormal lung sounds 2. Cough, unspecified type With chest congestion 3. Acute rhinosinusitis Normal vitals including ox saturation Has asthma, not on inhalers Has a hx of smoking , 1ppd, 10  pack-years, 1ppwk, for 2-3 years. Quit smoking in 2020. - DG Chest 2 View; Future to rule out bronchitis, pneumonia Advised symptomatic treatment. .  Increase fluids.  Rest.  Saline nasal spray. Mucinex as directed.  Humidifier in bedroom. Flonase OTC and Albuterol as directed.    - albuterol (VENTOLIN HFA) 108 (90 Base) MCG/ACT inhaler; Inhale 2 puffs into the lungs every 6 (six) hours as needed for wheezing or shortness of breath.  Dispense: 8 g; Refill: 2 Will reassess after receiving CXR results.  The patient was advised to call back or seek an in-person evaluation if the symptoms worsen or if the condition fails to improve as anticipated.  I discussed the assessment and treatment plan with the patient. The patient was provided an opportunity to ask questions and all were answered. The patient agreed with the plan and demonstrated an understanding of the instructions.  The entirety of the information documented in the History of Present Illness, Review of Systems and Physical Exam were personally obtained by me. Portions of this information were initially documented by the CMA and reviewed by me for thoroughness and accuracy.  Alisha Miller, [facility], [place] [phone] (phone) [phone] (fax)

## 2022-06-20 ENCOUNTER — Ambulatory Visit
Admission: RE | Admit: 2022-06-20 | Discharge: 2022-06-20 | Disposition: A | Discharge status: Home / Self Care | Payer: BC Managed Care – PPO | Source: Ambulatory Visit | Attending: Physician Assistant | Admitting: Physician Assistant

## 2022-06-20 ENCOUNTER — Ambulatory Visit
Admission: RE | Admit: 2022-06-20 | Discharge: 2022-06-20 | Disposition: A | Discharge status: Home / Self Care | Payer: BC Managed Care – PPO | Attending: Physician Assistant | Admitting: Physician Assistant

## 2022-06-20 DIAGNOSIS — R0989 Other specified symptoms and signs involving the circulatory and respiratory systems: Secondary | ICD-10-CM | POA: Diagnosis present

## 2022-06-20 DIAGNOSIS — R059 Cough, unspecified: Secondary | ICD-10-CM

## 2022-06-25 NOTE — Progress Notes (Signed)
Your CXR results showed no acute cardiopulmonary abnormalities. How are your current symptoms? What symptoms do you have? Are you doing better on albuterol inhaler? If yes, you most likely will be better on inhalers and possibly, prednisone.

## 2022-07-18 ENCOUNTER — Telehealth (INDEPENDENT_AMBULATORY_CARE_PROVIDER_SITE_OTHER): Payer: BC Managed Care – PPO | Admitting: Family Medicine

## 2022-07-18 ENCOUNTER — Encounter: Payer: Self-pay | Admitting: Family Medicine

## 2022-07-18 DIAGNOSIS — R0683 Snoring: Secondary | ICD-10-CM | POA: Diagnosis not present

## 2022-07-18 DIAGNOSIS — F430 Acute stress reaction: Secondary | ICD-10-CM

## 2022-07-18 DIAGNOSIS — R0681 Apnea, not elsewhere classified: Secondary | ICD-10-CM

## 2022-07-18 NOTE — Assessment & Plan Note (Addendum)
Husband (bed partner) reports apneic episodes and snoring Patient occasionally has nonrestorative sleep and daytime drowsiness, but not always I do think it is reasonable to screen for sleep apnea with home sleep test given these witnessed symptoms Discussed possible next steps pending results

## 2022-07-18 NOTE — Progress Notes (Signed)
I,Sulibeya S Dimas,acting as a Education administrator for Lavon Paganini, MD.,have documented all relevant documentation on the behalf of Lavon Paganini, MD,as directed by  Lavon Paganini, MD while in the presence of Lavon Paganini, MD.   MyChart Video Visit    Virtual Visit via Video Note   This format is felt to be most appropriate for this patient at this time. Physical exam was limited by quality of the video and audio technology used for the visit.    Patient location: home Provider location: [place] involved in the visit: patient, provider   I discussed the limitations of evaluation and management by telemedicine and the availability of in person appointments. The patient expressed understanding and agreed to proceed.  Patient: Alisha Miller   DOB: June 25, 1976   47 y.o. Female  MRN: [id number] Visit Date: 07/18/2022  Today's healthcare provider: Lavon Paganini, MD   Chief Complaint  Patient presents with   Anxiety   Subjective    HPI  Anxiety, Follow-up  She was last seen for anxiety 6 weeks ago. Changes made at last visit include start Zoloft 50 mg daily. Xanax sparingly PRN.    She reports excellent compliance with treatment. She reports good tolerance of treatment.  Has not needed to take the Xanax at all. She is not having side effects. Felt like she had an out of body experience the first time she took the Zoloft.  Decreased it to '25mg'$  daily and now doing well.  She feels her anxiety is mild and Improved since last visit.  GAD-7 Results    06/07/2022    8:53 AM  GAD-7 Generalized Anxiety Disorder Screening Tool  1. Feeling Nervous, Anxious, or on Edge 3  2. Not Being Able to Stop or Control Worrying 2  3. Worrying Too Much About Different Things 2  4. Trouble Relaxing 1  5. Being So Restless it's Hard To Sit Still 1  6. Becoming Easily Annoyed or Irritable 2  7. Feeling Afraid As If Something Awful Might Happen 0  Total GAD-7  Score 11  Difficulty At Work, Home, or Getting  Along With Others? Somewhat difficult    PHQ-9 Scores    06/07/2022    8:49 AM 02/08/2022   10:02 AM 02/02/2021   10:48 AM  PHQ9 SCORE ONLY  PHQ-9 Total Score '12 3 5    '$ --------------------------------------------------------------------------------------------------- Husband was recently diagnosed with sleep apnea and now he is worried that she has it. He reports apnea, snoring. No AM headaches.  Needs more than 8 hours to feel rested.  Occasional daytime drowsiness.   Medications: Outpatient Medications Prior to Visit  Medication Sig   albuterol (VENTOLIN HFA) 108 (90 Base) MCG/ACT inhaler Inhale 2 puffs into the lungs every 6 (six) hours as needed for wheezing or shortness of breath.   ALPRAZolam (XANAX) 0.5 MG tablet Take 0.5-1 tablets (0.25-0.5 mg total) by mouth 2 (two) times daily as needed for anxiety.   buPROPion (WELLBUTRIN XL) 300 MG 24 hr tablet TAKE 1 TABLET BY MOUTH EVERY DAY   fluticasone (FLONASE) 50 MCG/ACT nasal spray Place 2 sprays into both nostrils daily.   levonorgestrel (MIRENA) 20 MCG/24HR IUD by Intrauterine route.   loratadine (CLARITIN) 10 MG tablet Take 10 mg by mouth daily.   minocycline (MINOCIN) 50 MG capsule    sertraline (ZOLOFT) 50 MG tablet Take 1 tablet (50 mg total) by mouth daily.   SULFACLEANSE 8/4 8-4 % SUSP    valACYclovir (VALTREX) 500 MG tablet  Take 1 tablet (500 mg total) by mouth 2 (two) times daily.   No facility-administered medications prior to visit.    Review of Systems per HPI     Objective    There were no vitals taken for this visit.     Physical Exam Constitutional:      General: She is not in acute distress.    Appearance: Normal appearance.  HENT:     Head: Normocephalic.  Pulmonary:     Effort: Pulmonary effort is normal. No respiratory distress.  Neurological:     Mental Status: She is alert and oriented to person, place, and time. Mental status is at baseline.         Assessment & Plan     Problem List Items Addressed This Visit       Other   Acute reaction to situational stress - Primary    Exacerbation of anxiety related to the death of her father Improving but not to goal Will increase Zoloft to 50 mg daily from 25 mg daily Has xanax, but has not needed it.      Witnessed apneic spells    Husband (bed partner) reports apneic episodes and snoring Patient occasionally has nonrestorative sleep and daytime drowsiness, but not always I do think it is reasonable to screen for sleep apnea with home sleep test given these witnessed symptoms Discussed possible next steps pending results      Relevant Orders   Ambulatory referral to Sleep Studies   Other Visit Diagnoses     Snoring       Relevant Orders   Ambulatory referral to Sleep Studies        Return for as scheduled.     I discussed the assessment and treatment plan with the patient. The patient was provided an opportunity to ask questions and all were answered. The patient agreed with the plan and demonstrated an understanding of the instructions.   The patient was advised to call back or seek an in-person evaluation if the symptoms worsen or if the condition fails to improve as anticipated.  I, Lavon Paganini, MD, have reviewed all documentation for this visit. The documentation on 07/18/22 for the exam, diagnosis, procedures, and orders are all accurate and complete.   Noor Witte, Dionne Bucy, MD, MPH [facility]

## 2022-07-18 NOTE — Assessment & Plan Note (Signed)
Exacerbation of anxiety related to the death of her father Improving but not to goal Will increase Zoloft to 50 mg daily from 25 mg daily Has xanax, but has not needed it.

## 2022-08-08 ENCOUNTER — Encounter: Payer: Self-pay | Admitting: Family Medicine

## 2022-08-08 ENCOUNTER — Telehealth: Payer: Self-pay

## 2022-08-08 DIAGNOSIS — G4730 Sleep apnea, unspecified: Secondary | ICD-10-CM

## 2022-08-08 NOTE — Progress Notes (Signed)
Patient advised as below. She will talk to her dentist first.

## 2022-08-08 NOTE — Telephone Encounter (Signed)
Patient reports she called [facility] and they requested a referral. Referral placed.    Copied from [place] [id number]. Topic: General - Other >> Aug 08, 2022  3:28 PM Dominique A wrote: Reason for CRM: Pt is calling to speak back with the nurse regarding her sleep study results and to talk with her about the custom oral appliance or the CPAP.   Please call pt back

## 2022-08-08 NOTE — Progress Notes (Signed)
Home sleep study report received.  During the recording there was evidence of mild sleep apnea with AHI of 11.2.  Patient would benefit from CPAP therapy.  She could also discuss with her dentist about a custom oral appliance to prevent snoring if she would rather instead.  If she decides to go ahead with CPAP therapy, I recommend that we send a prescription for autoPAP with heated humidity, nasal pillows mask, and all necessary tubing, filters, headgear.  Anden Bartolo, Dionne Bucy, MD, MPH [facility]

## 2022-08-15 ENCOUNTER — Telehealth: Payer: Self-pay

## 2022-08-15 NOTE — Telephone Encounter (Signed)
Copied from [place]. Topic: Medical Record Request - Provider/Facility Request >> Aug 15, 2022 10:50 AM Everette C wrote: Reason for CRM: Claiborne Billings with [facility] has called to request a copy of the patient's recent sleep study from 07/27/22 to be faxed to their office at [phone]   Please contact further if needed

## 2022-08-30 ENCOUNTER — Encounter: Payer: Self-pay | Admitting: Family Medicine

## 2022-09-02 ENCOUNTER — Other Ambulatory Visit: Payer: Self-pay

## 2022-09-02 MED ORDER — VALACYCLOVIR HCL 500 MG PO TABS: 500.0000 mg | ORAL_TABLET | Freq: Two times a day (BID) | ORAL | 2 refills | Status: DC

## 2022-09-23 ENCOUNTER — Telehealth: Payer: Self-pay

## 2022-09-23 NOTE — Telephone Encounter (Signed)
Copied from [place] [id number]. Topic: General - Other >> Sep 23, 2022  1:58 PM [place] B wrote: Reason for CRM: Claiborne Billings from [facility] called in says needs soap notes sent in in order for pt to get sleep appliance. Fx # [phone]

## 2022-09-26 NOTE — Telephone Encounter (Signed)
Patient requested to come in and have documented why its not an option for her to use CPAP. Appt 10/07/22. She declined to start order for CPAP.

## 2022-09-26 NOTE — Telephone Encounter (Signed)
It sounds like her insurance will not cover oral appliance unless she has failed CPAP.  We do not have records to send for this as she has not followed up with [place]. She can have f/u appt or we can Rx autoPap (see previous documentation with list of supplies and what to order).

## 2022-09-26 NOTE — Telephone Encounter (Signed)
Called Kelly at [facility] to let her know that patient is scheduled on 10/07/22. She reports that we need to document why patient is not a candidate for CPAP. Or if she is not able to tolerate CPAP.

## 2022-09-26 NOTE — Telephone Encounter (Signed)
Alisha Miller with [facility] is requesting more information per the insurance needing a follow up visit to discuss the results. Needing documentation the patient can not not stand the CPAP or is not a candidate for a CPAP. [phone] Fax [phone]

## 2022-10-07 ENCOUNTER — Ambulatory Visit: Payer: BC Managed Care – PPO | Admitting: Family Medicine

## 2022-10-07 ENCOUNTER — Encounter: Payer: Self-pay | Admitting: Family Medicine

## 2022-10-07 VITALS — BP 128/85 | HR 89 | Temp 97.7°F | Resp 16 | Wt 198.0 lb

## 2022-10-07 DIAGNOSIS — G4733 Obstructive sleep apnea (adult) (pediatric): Secondary | ICD-10-CM | POA: Diagnosis not present

## 2022-10-07 DIAGNOSIS — R232 Flushing: Secondary | ICD-10-CM

## 2022-10-07 NOTE — Assessment & Plan Note (Signed)
Chronic and not currently managed with any intervention. Discussed options of referral to ENT to learn more about Inspire versus CPAP or oral appliance.  Decided to pursue oral appliance. Follow up with dentist for oral appliance.

## 2022-10-07 NOTE — Progress Notes (Signed)
Established Patient Office Visit  Subjective   Patient ID: Alisha Miller, female    DOB: 1975/08/30  Age: 47 y.o. MRN: [id number]  Chief Complaint  Patient presents with   Follow-up    HPI [name] presents to clinic today for a follow up for sleep apnea and to discuss hot flashes.   Sleep apnea Since last visit she has completed a sleep study that indicated she would benefit from CPAP therapy. Would benefit from CPAP or oral appliance. Wants to get an oral appliance, because it will do the job of her mouth guard and CPAP in one. Has exposure to CPAP (husband has one).   Hot flashes  Sweating and gets hot frequently.  Also notes felling flushed without trigger and vaginal dryness.  Has IUD and does not have periods. Estimates it was placed in 2018. Needs to replace it soon.  Estimates that her mother went through menopause in mid 103s.     Review of Systems  Constitutional:  Negative for chills, fever and malaise/fatigue.       Hot flashes   Respiratory:  Negative for shortness of breath.   Cardiovascular:  Negative for chest pain and palpitations.  Skin:  Negative for rash.  Neurological:  Negative for weakness.  Psychiatric/Behavioral:  The patient is not nervous/anxious.       Objective:     BP 128/85 (BP Location: Left Arm, Patient Position: Sitting, Cuff Size: Large)   Pulse 89   Temp 97.7 F (36.5 C) (Temporal)   Resp 16   Wt 198 lb (89.8 kg)   SpO2 99%   BMI 36.21 kg/m    Physical Exam Constitutional:      General: She is not in acute distress.    Appearance: Normal appearance.  HENT:     Head: Normocephalic and atraumatic.  Neck:     Thyroid: No thyroid mass or thyromegaly.  Cardiovascular:     Pulses: Normal pulses.     Heart sounds: Normal heart sounds.  Pulmonary:     Effort: Pulmonary effort is normal.     Breath sounds: Normal breath sounds.  Musculoskeletal:     Cervical back: Normal range of motion.  Neurological:     Mental  Status: She is alert.  Psychiatric:        Mood and Affect: Mood and affect normal.      No results found for any visits on 10/07/22.    The 10-year ASCVD risk score (Arnett DK, et al., 2019) is: 0.8%    Assessment & Plan:   Problem List Items Addressed This Visit     OSA (obstructive sleep apnea) - Primary    Chronic and not currently managed with any intervention. Discussed options of referral to ENT to learn more about Inspire versus CPAP or oral appliance.  Decided to pursue oral appliance. Follow up with dentist for oral appliance.       Other Visit Diagnoses     Hot flashes       Relevant Orders   FSH/LH     2. Hot flashes With mother's onset of menopause in mid-40's, likely perimenopause or menopause.  She has an IUD and does not know her last period, cannot use last period to establish menopause. Ordered FSH and LH.  Instructed to call OBGYN to replace IUD.   Follow up in August for annual physical.   Gilmer Mor, Medical Student   Patient seen along with MS3 student Ezekiel Slocumb. I personally evaluated  this patient along with the student, and verified all aspects of the history, physical exam, and medical decision making as documented by the student. I agree with the student's documentation and have made all necessary edits.  Shalese Strahan, Marzella Schlein, MD, MPH [facility]

## 2022-10-21 NOTE — Telephone Encounter (Addendum)
Tresa Endo, from [facility] Stated pt was supposed to come in on 04/08 for f/u. Stated she needs soap notes sent in for pt sleep appliance. Tresa Endo mentioned [employer] is asking for it before they give her a quote.  Please advise.

## 2022-10-22 NOTE — Telephone Encounter (Signed)
Note faxed to [facility] [phone]

## 2022-10-22 NOTE — Telephone Encounter (Signed)
Alisha Miller with [facility] states that their fax machine is not working and they did not receive the fax sent today. Alisha Miller is wanting to know if the information can be mailed to them so they can submit it to [employer] for approval for the device for the pt.   [facility] [address] [place], [place] [address]

## 2022-10-28 ENCOUNTER — Other Ambulatory Visit: Payer: Self-pay | Admitting: Family Medicine

## 2022-10-28 ENCOUNTER — Telehealth: Payer: Self-pay

## 2022-10-28 NOTE — Telephone Encounter (Signed)
Copied from CRM [id number]. Topic: General - Other >> Oct 28, 2022 10:14 AM Dondra Prader A wrote: Reason for CRM: Pt states she was seen in the beginning of April with PCP and she had a consultation and went over her sleep study. Pt is checking to see if her information was sent over to [facility] for her. Please advise.

## 2022-10-28 NOTE — Telephone Encounter (Signed)
Advised that notes were mailed as Lianne Bushy fax was broke.

## 2022-10-29 ENCOUNTER — Encounter: Payer: Self-pay | Admitting: Family Medicine

## 2022-10-29 LAB — FSH/LH
FSH: 2.7 m[IU]/mL
LH: 4 m[IU]/mL

## 2023-02-13 NOTE — Progress Notes (Signed)
Complete physical exam  Patient: Alisha Miller   DOB: 1975/07/10   46 y.o. Female  MRN: [id number]  Subjective:    Chief Complaint  Patient presents with   Annual Exam    Alisha Miller is a 47 y.o. female who presents today for a complete physical exam. She reports consuming a general diet.  She generally feels fairly well. She reports sleeping well. She does have additional problems to discuss today.   Concerned about her drinking. She is drinking about 5 drinks per day on average. Her drinking has been problematic for her since her mother passed away. She is ready to quit. Seeing a therapist.  Concerned about stigma  Most recent fall risk assessment:    02/14/2023    8:26 AM  Fall Risk   Falls in the past year? 0  Number falls in past yr: 0  Injury with Fall? 0  Risk for fall due to : No Fall Risks  Follow up Falls evaluation completed     Most recent depression screenings:    02/14/2023    8:26 AM 10/07/2022    4:00 PM  PHQ 2/9 Scores  PHQ - 2 Score 3 1  PHQ- 9 Score 8 7        Patient Care Team: Erasmo Downer, MD as PCP - General (Family Medicine)   Outpatient Medications Prior to Visit  Medication Sig   albuterol (VENTOLIN HFA) 108 (90 Base) MCG/ACT inhaler Inhale 2 puffs into the lungs every 6 (six) hours as needed for wheezing or shortness of breath.   buPROPion (WELLBUTRIN XL) 300 MG 24 hr tablet TAKE 1 TABLET BY MOUTH EVERY DAY   fluticasone (FLONASE) 50 MCG/ACT nasal spray Place 2 sprays into both nostrils daily.   levonorgestrel (MIRENA) 20 MCG/24HR IUD by Intrauterine route.   loratadine (CLARITIN) 10 MG tablet Take 10 mg by mouth daily.   minocycline (MINOCIN) 50 MG capsule    sertraline (ZOLOFT) 50 MG tablet TAKE 1 TABLET BY MOUTH EVERY DAY   SULFACLEANSE 8/4 8-4 % SUSP    valACYclovir (VALTREX) 500 MG tablet Take 1 tablet (500 mg total) by mouth 2 (two) times daily.   [DISCONTINUED] ALPRAZolam (XANAX) 0.5 MG tablet Take 0.5-1 tablets  (0.25-0.5 mg total) by mouth 2 (two) times daily as needed for anxiety.   No facility-administered medications prior to visit.    ROS per HPI     Objective:     BP 121/74 (BP Location: Right Arm, Patient Position: Sitting, Cuff Size: Large)   Pulse 79   Temp 97.9 F (36.6 C) (Temporal)   Resp 12   Ht 5\' 2"  (1.575 m)   Wt 197 lb 6.4 oz (89.5 kg)   SpO2 99%   BMI 36.10 kg/m    Physical Exam Vitals reviewed.  Constitutional:      General: She is not in acute distress.    Appearance: Normal appearance. She is well-developed. She is not diaphoretic.  HENT:     Head: Normocephalic and atraumatic.     Right Ear: Tympanic membrane, ear canal and external ear normal.     Left Ear: Tympanic membrane, ear canal and external ear normal.     Nose: Nose normal.     Mouth/Throat:     Mouth: Mucous membranes are moist.     Pharynx: Oropharynx is clear. No oropharyngeal exudate.  Eyes:     General: No scleral icterus.    Conjunctiva/sclera: Conjunctivae normal.  Pupils: Pupils are equal, round, and reactive to light.  Neck:     Thyroid: No thyromegaly.  Cardiovascular:     Rate and Rhythm: Normal rate and regular rhythm.     Pulses: Normal pulses.     Heart sounds: Normal heart sounds. No murmur heard. Pulmonary:     Effort: Pulmonary effort is normal. No respiratory distress.     Breath sounds: Normal breath sounds. No wheezing or rales.  Abdominal:     General: There is no distension.     Palpations: Abdomen is soft.     Tenderness: There is no abdominal tenderness.  Musculoskeletal:        General: No deformity.     Cervical back: Neck supple.     Right lower leg: No edema.     Left lower leg: No edema.  Lymphadenopathy:     Cervical: No cervical adenopathy.  Skin:    General: Skin is warm and dry.     Findings: No rash.  Neurological:     Mental Status: She is alert and oriented to person, place, and time. Mental status is at baseline.     Gait: Gait normal.   Psychiatric:        Mood and Affect: Mood normal.        Behavior: Behavior normal.        Thought Content: Thought content normal.      No results found for any visits on 02/14/23.     Assessment & Plan:    Routine Health Maintenance and Physical Exam  Immunization History  Administered Date(s) Administered   Moderna Sars-Covid-2 Vaccination 03/15/2020, 04/12/2020   Td 03/22/1998, 02/02/2021   Tdap 09/27/2010    Health Maintenance  Topic Date Due   COVID-19 Vaccine (3 - 2023-24 season) 03/01/2022   INFLUENZA VACCINE  09/29/2023 (Originally 01/30/2023)   MAMMOGRAM  03/12/2023   PAP SMEAR-Modifier  02/01/2025   DTaP/Tdap/Td (4 - Td or Tdap) 02/03/2031   Colonoscopy  04/19/2032   Hepatitis C Screening  Completed   HIV Screening  Completed   HPV VACCINES  Aged Out    Discussed health benefits of physical activity, and encouraged her to engage in regular exercise appropriate for her age and condition.  Problem List Items Addressed This Visit       Respiratory   OSA (obstructive sleep apnea)    Got new mouth guards and snoring is improving Titrating with dentist        Other   Family history of diabetes mellitus    Will continue to monitor annual A1c Discussed low carb diet and exercise      Relevant Orders   Hemoglobin A1c   Recurrent major depressive disorder, in partial remission (HCC)    Chronic and fairly well controlled Continue therapy and wellbutrin and zoloft at current doses Quitting drinking will help also      GAD (generalized anxiety disorder)    Chronic, was previously well controlled Exacerbated by recent passing of her father Continue wellbutrin, zoloft, and therapy Alcohol cessation will help      Obesity (BMI 30-39.9)    Discussed importance of healthy weight management Discussed diet and exercise       Right flank pain    New problem  Exacerbated by drinking or eating fatty foods Concern for possible gallbladder pathology, such  as stones  Will check RUQ [place] No symptoms of kidney stone      Relevant Orders   US Abdomen Limited RUQ (  LIVER/GB)   Other Visit Diagnoses     Encounter for annual physical exam    -  Primary   Relevant Orders   MM 3D SCREENING MAMMOGRAM BILATERAL BREAST   CBC with Differential/Platelet   Hemoglobin A1c   Comprehensive metabolic panel   Lipid panel   Breast cancer screening by mammogram       Relevant Orders   MM 3D SCREENING MAMMOGRAM BILATERAL BREAST       Will use naltrexone for helping her quit drinking. She is concerned about the diagnosis code being in the chart.   Return in about 2 months (around 04/16/2023) for chronic disease f/u, virtual ok.     Shirlee Latch, MD

## 2023-02-14 ENCOUNTER — Encounter: Payer: Self-pay | Admitting: Family Medicine

## 2023-02-14 ENCOUNTER — Ambulatory Visit (INDEPENDENT_AMBULATORY_CARE_PROVIDER_SITE_OTHER): Payer: BC Managed Care – PPO | Admitting: Family Medicine

## 2023-02-14 VITALS — BP 121/74 | HR 79 | Temp 97.9°F | Resp 12 | Ht 62.0 in | Wt 197.4 lb

## 2023-02-14 DIAGNOSIS — Z1231 Encounter for screening mammogram for malignant neoplasm of breast: Secondary | ICD-10-CM

## 2023-02-14 DIAGNOSIS — F3341 Major depressive disorder, recurrent, in partial remission: Secondary | ICD-10-CM

## 2023-02-14 DIAGNOSIS — Z0000 Encounter for general adult medical examination without abnormal findings: Secondary | ICD-10-CM

## 2023-02-14 DIAGNOSIS — R109 Unspecified abdominal pain: Secondary | ICD-10-CM

## 2023-02-14 DIAGNOSIS — E669 Obesity, unspecified: Secondary | ICD-10-CM

## 2023-02-14 DIAGNOSIS — G4733 Obstructive sleep apnea (adult) (pediatric): Secondary | ICD-10-CM | POA: Diagnosis not present

## 2023-02-14 DIAGNOSIS — F411 Generalized anxiety disorder: Secondary | ICD-10-CM

## 2023-02-14 DIAGNOSIS — Z1211 Encounter for screening for malignant neoplasm of colon: Secondary | ICD-10-CM

## 2023-02-14 DIAGNOSIS — Z833 Family history of diabetes mellitus: Secondary | ICD-10-CM

## 2023-02-14 MED ORDER — NALTREXONE HCL 50 MG PO TABS: 50.0000 mg | ORAL_TABLET | Freq: Every day | ORAL | 5 refills | Status: DC

## 2023-02-14 NOTE — Assessment & Plan Note (Signed)
Discussed importance of healthy weight management Discussed diet and exercise  

## 2023-02-14 NOTE — Assessment & Plan Note (Signed)
Will continue to monitor annual A1c Discussed low carb diet and exercise

## 2023-02-14 NOTE — Assessment & Plan Note (Signed)
New problem  Exacerbated by drinking or eating fatty foods Concern for possible gallbladder pathology, such as stones  Will check RUQ [place] No symptoms of kidney stone

## 2023-02-14 NOTE — Assessment & Plan Note (Signed)
Chronic and fairly well controlled Continue therapy and wellbutrin and zoloft at current doses Quitting drinking will help also

## 2023-02-14 NOTE — Assessment & Plan Note (Signed)
Got new mouth guards and snoring is improving Titrating with dentist

## 2023-02-14 NOTE — Assessment & Plan Note (Signed)
Chronic, was previously well controlled Exacerbated by recent passing of her father Continue wellbutrin, zoloft, and therapy Alcohol cessation will help

## 2023-02-15 LAB — LIPID PANEL
Chol/HDL Ratio: 3.4 ratio (ref 0.0–4.4)
Cholesterol, Total: 191 mg/dL (ref 100–199)
HDL: 56 mg/dL (ref >39)
LDL Chol Calc (NIH): 115 mg/dL — ABNORMAL HIGH (ref 0–99)
Triglycerides: 109 mg/dL (ref 0–149)
VLDL Cholesterol Cal: 20 mg/dL (ref 5–40)

## 2023-02-15 LAB — CBC WITH DIFFERENTIAL/PLATELET
Basophils Absolute: 0 10*3/uL (ref 0.0–0.2)
Basos: 1 %
EOS (ABSOLUTE): 0.1 10*3/uL (ref 0.0–0.4)
Eos: 2 %
Hematocrit: 41.3 % (ref 34.0–46.6)
Hemoglobin: 13.6 g/dL (ref 11.1–15.9)
Immature Grans (Abs): 0 10*3/uL (ref 0.0–0.1)
Immature Granulocytes: 0 %
Lymphocytes Absolute: 0.9 10*3/uL (ref 0.7–3.1)
Lymphs: 25 %
MCH: 29.4 pg (ref 26.6–33.0)
MCHC: 32.9 g/dL (ref 31.5–35.7)
MCV: 89 fL (ref 79–97)
Monocytes Absolute: 0.3 10*3/uL (ref 0.1–0.9)
Monocytes: 10 %
Neutrophils Absolute: 2.2 10*3/uL (ref 1.4–7.0)
Neutrophils: 62 %
Platelets: 269 10*3/uL (ref 150–450)
RBC: 4.63 x10E6/uL (ref 3.77–5.28)
RDW: 13.1 % (ref 11.7–15.4)
WBC: 3.5 10*3/uL (ref 3.4–10.8)

## 2023-02-15 LAB — COMPREHENSIVE METABOLIC PANEL
ALT: 20 IU/L (ref 0–32)
AST: 16 IU/L (ref 0–40)
Albumin: 4.3 g/dL (ref 3.9–4.9)
Alkaline Phosphatase: 65 IU/L (ref 44–121)
BUN/Creatinine Ratio: 14 (ref 9–23)
BUN: 10 mg/dL (ref 6–24)
Bilirubin Total: 0.5 mg/dL (ref 0.0–1.2)
CO2: 23 mmol/L (ref 20–29)
Calcium: 9.4 mg/dL (ref 8.7–10.2)
Chloride: 102 mmol/L (ref 96–106)
Creatinine, Ser: 0.73 mg/dL (ref 0.57–1.00)
Globulin, Total: 2.2 g/dL (ref 1.5–4.5)
Glucose: 93 mg/dL (ref 70–99)
Potassium: 4.1 mmol/L (ref 3.5–5.2)
Sodium: 137 mmol/L (ref 134–144)
Total Protein: 6.5 g/dL (ref 6.0–8.5)
eGFR: 102 mL/min/{1.73_m2} (ref >59)

## 2023-02-15 LAB — HEMOGLOBIN A1C
Est. average glucose Bld gHb Est-mCnc: 108 mg/dL
Hgb A1c MFr Bld: 5.4 % (ref 4.8–5.6)

## 2023-02-21 IMAGING — MG MM DIGITAL SCREENING BILAT W/ TOMO AND CAD
6 of 10 series · 6 of 30 positions shown · non-contrast
Comparison: Previous exam(s).

CLINICAL DATA: Screening.

EXAM:
DIGITAL SCREENING BILATERAL MAMMOGRAM WITH TOMOSYNTHESIS AND CAD
TECHNIQUE: Bilateral screening digital craniocaudal and mediolateral oblique
mammograms were obtained. Bilateral screening digital breast
tomosynthesis was performed. The images were evaluated with
computer-aided detection.

[R CC synth-2D]
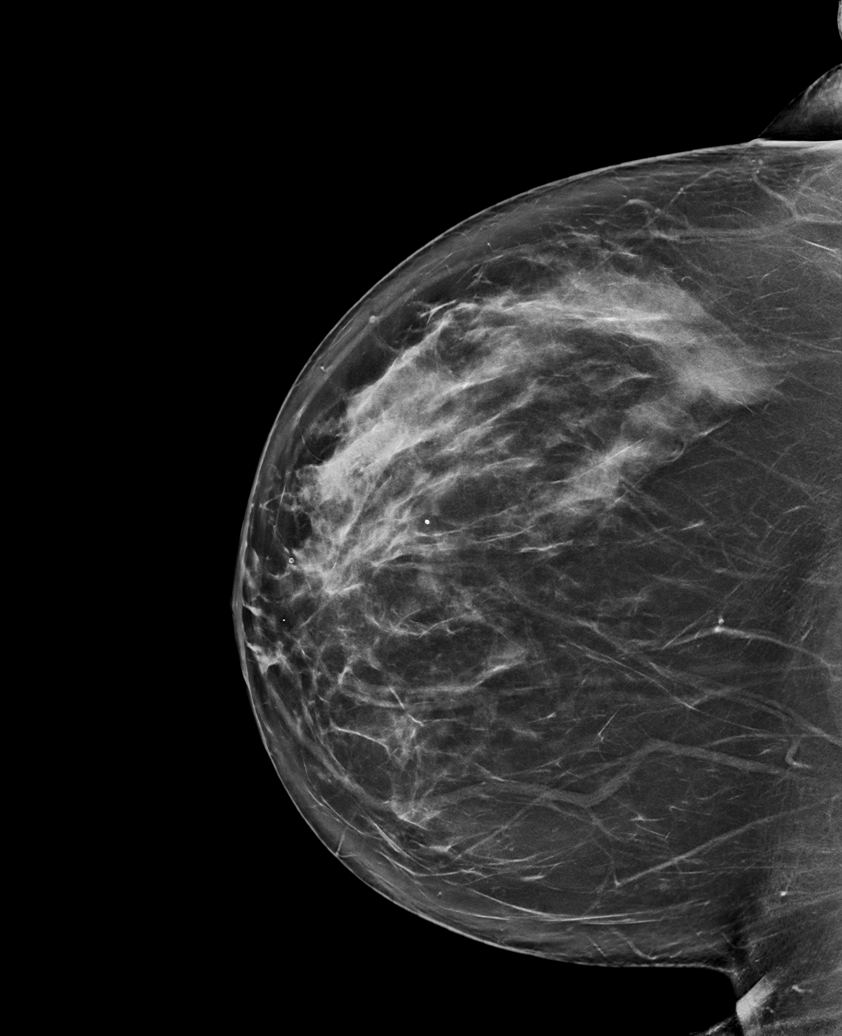

[R MLO synth-2D (1 of 2)]
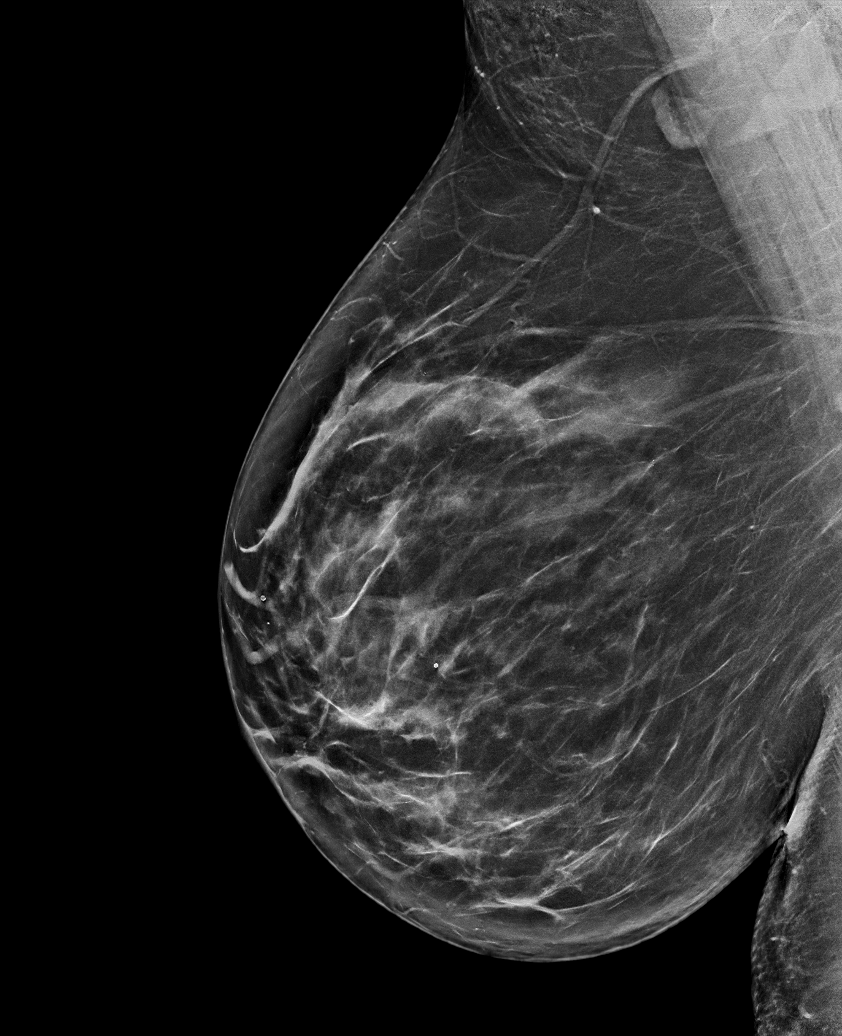

[L CC synth-2D]
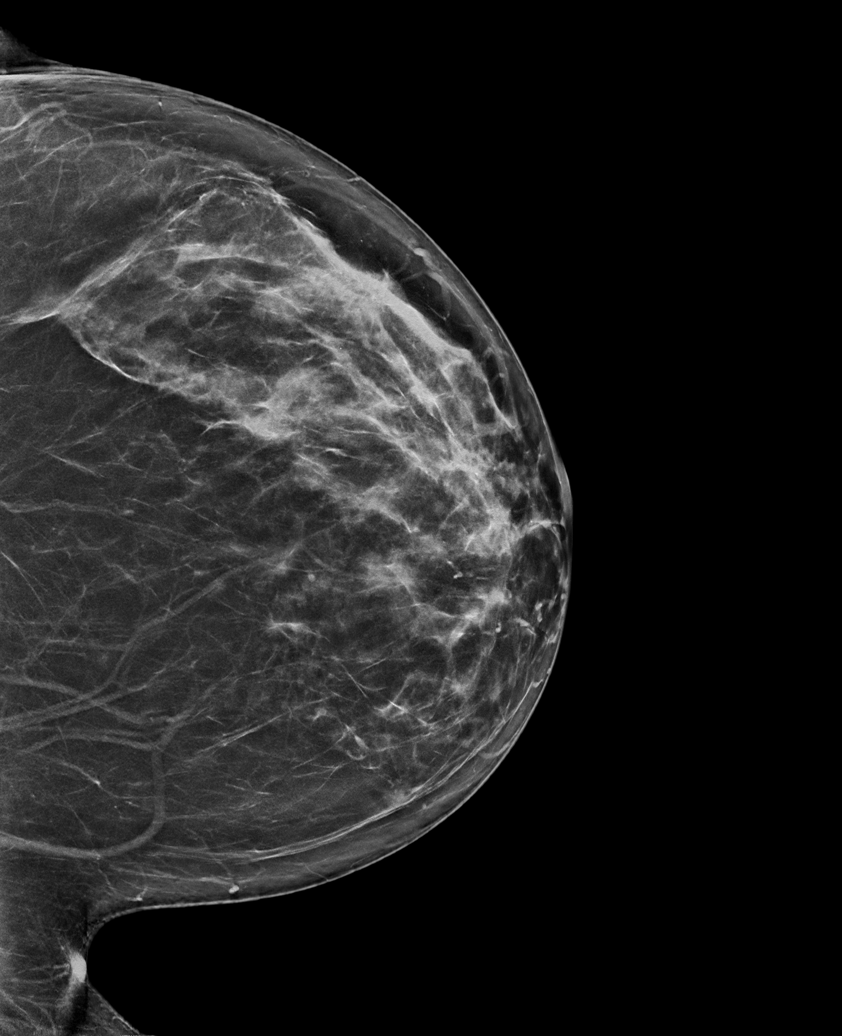

[L MLO synth-2D]
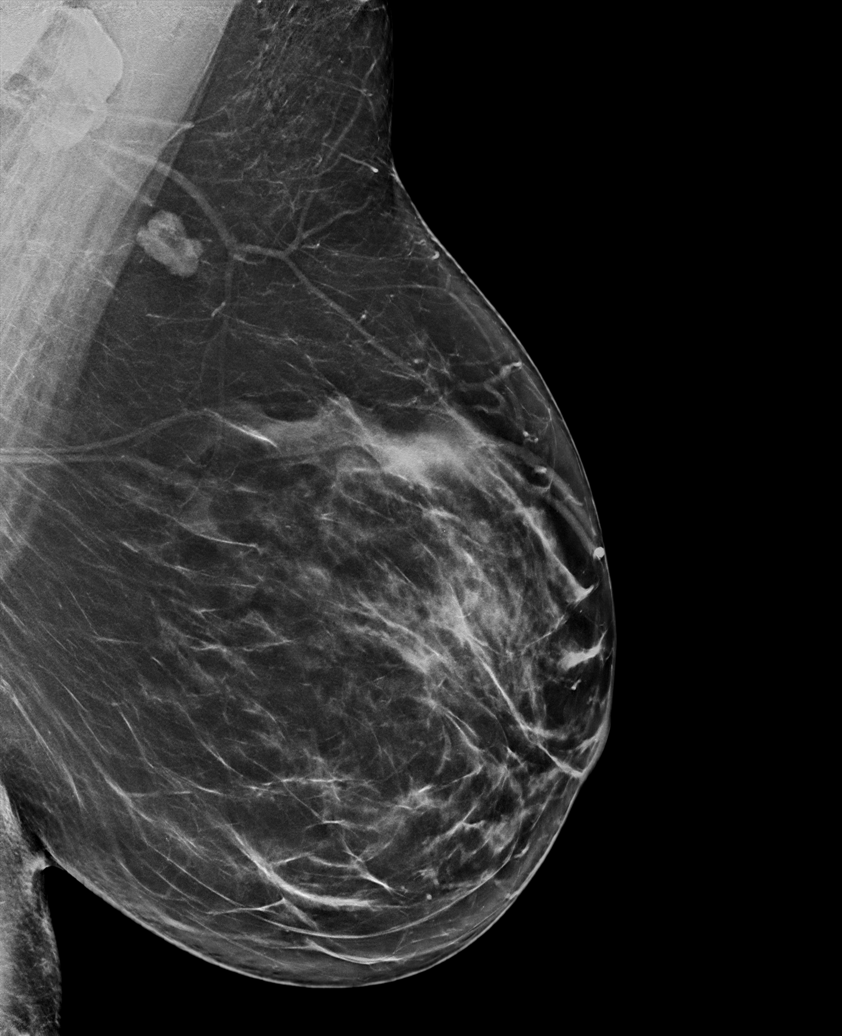

[R MLO synth-2D (2 of 2)]
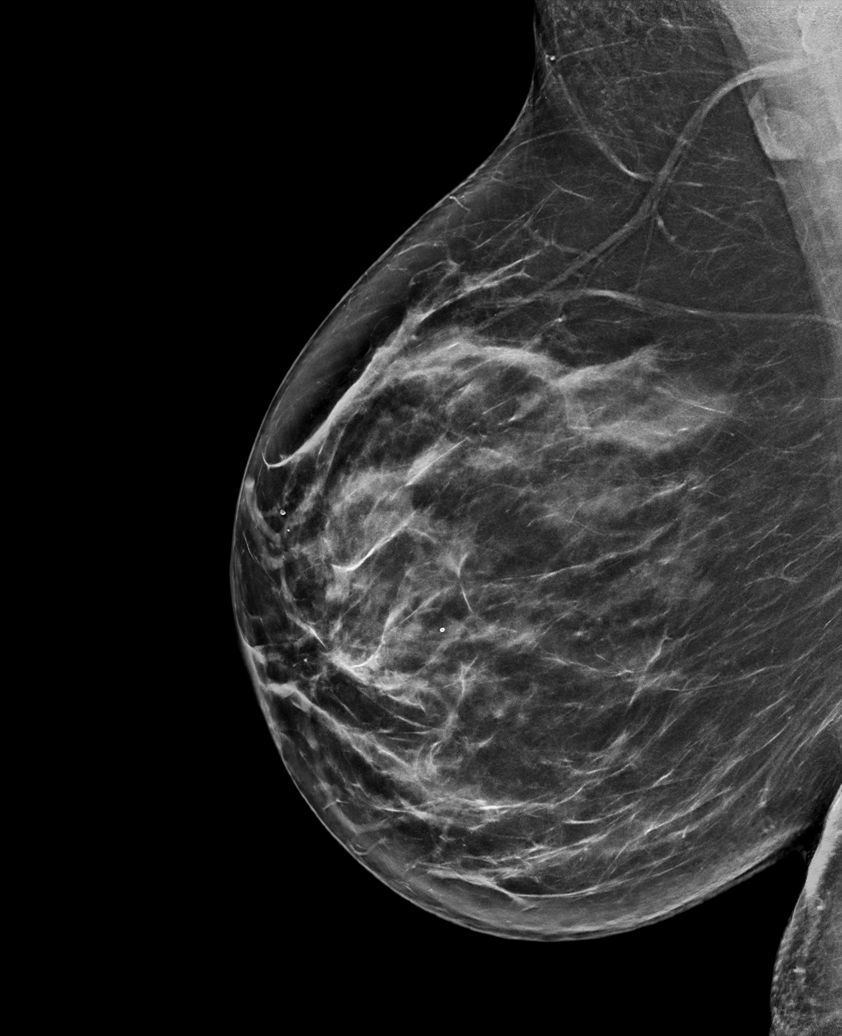

[L MLO tomo · tomo slice 43/85.0]
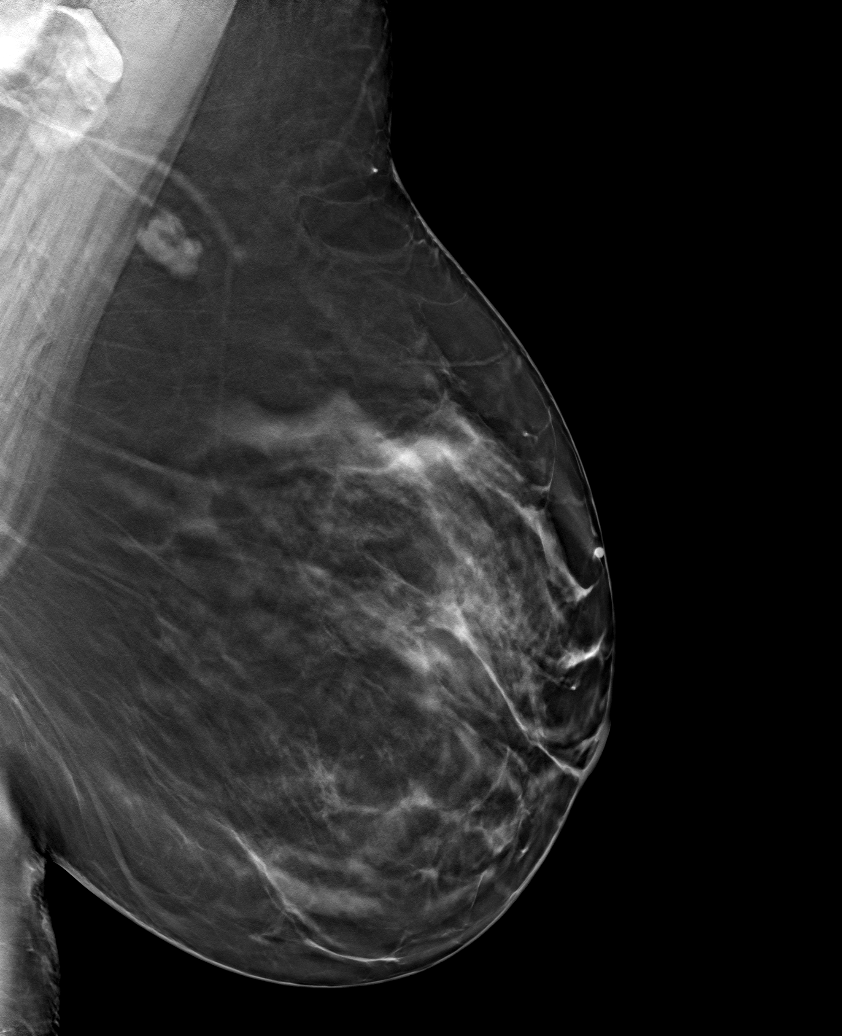

[6 of 30 positions shown; findings below may reference images not displayed]

ACR Breast Density Category c: The breast tissue is heterogeneously
dense, which may obscure small masses.
FINDINGS: There are no findings suspicious for malignancy.
IMPRESSION: No mammographic evidence of malignancy. A result letter of this
screening mammogram will be mailed directly to the patient.

RECOMMENDATION:
Screening mammogram in one year. (Code:Q3-W-BC3)

BI-RADS CATEGORY  1: Negative.

## 2023-03-12 ENCOUNTER — Ambulatory Visit
Admission: RE | Admit: 2023-03-12 | Discharge: 2023-03-12 | Disposition: A | Discharge status: Home / Self Care | Payer: BC Managed Care – PPO | Source: Ambulatory Visit | Attending: Family Medicine | Admitting: Family Medicine

## 2023-03-12 DIAGNOSIS — R109 Unspecified abdominal pain: Secondary | ICD-10-CM | POA: Diagnosis present

## 2023-03-14 ENCOUNTER — Encounter: Payer: Self-pay | Admitting: Family Medicine

## 2023-03-18 ENCOUNTER — Ambulatory Visit
Admission: RE | Admit: 2023-03-18 | Discharge: 2023-03-18 | Disposition: A | Discharge status: Home / Self Care | Payer: BC Managed Care – PPO | Source: Ambulatory Visit | Attending: Family Medicine | Admitting: Family Medicine

## 2023-03-18 DIAGNOSIS — Z0000 Encounter for general adult medical examination without abnormal findings: Secondary | ICD-10-CM

## 2023-03-18 DIAGNOSIS — Z1231 Encounter for screening mammogram for malignant neoplasm of breast: Secondary | ICD-10-CM | POA: Diagnosis present

## 2023-03-19 ENCOUNTER — Other Ambulatory Visit: Payer: Self-pay | Admitting: Family Medicine

## 2023-03-26 ENCOUNTER — Encounter: Payer: Self-pay | Admitting: Physician Assistant

## 2023-04-17 ENCOUNTER — Telehealth: Payer: BC Managed Care – PPO | Admitting: Family Medicine

## 2023-04-17 ENCOUNTER — Ambulatory Visit: Payer: BC Managed Care – PPO | Admitting: Family Medicine

## 2023-04-17 ENCOUNTER — Encounter: Payer: Self-pay | Admitting: Family Medicine

## 2023-04-17 DIAGNOSIS — F411 Generalized anxiety disorder: Secondary | ICD-10-CM | POA: Diagnosis not present

## 2023-04-17 DIAGNOSIS — K760 Fatty (change of) liver, not elsewhere classified: Secondary | ICD-10-CM

## 2023-04-17 DIAGNOSIS — F3341 Major depressive disorder, recurrent, in partial remission: Secondary | ICD-10-CM | POA: Diagnosis not present

## 2023-04-17 MED ORDER — SERTRALINE HCL 50 MG PO TABS: 50.0000 mg | ORAL_TABLET | Freq: Every day | ORAL | 3 refills | Status: DC

## 2023-04-17 MED ORDER — NALTREXONE HCL 50 MG PO TABS: 50.0000 mg | ORAL_TABLET | Freq: Every day | ORAL | 3 refills | Status: DC

## 2023-04-17 MED ORDER — BUPROPION HCL ER (XL) 300 MG PO TB24
300.0000 mg | ORAL_TABLET | Freq: Every day | ORAL | 3 refills | Status: DC
Start: 2023-04-17 — End: 2024-05-21

## 2023-04-17 NOTE — Progress Notes (Signed)
MyChart Video Visit    Virtual Visit via Video Note   This format is felt to be most appropriate for this patient at this time. Physical exam was limited by quality of the video and audio technology used for the visit.    Patient location: home Provider location: [facility] Persons involved in the visit: patient, provider  I discussed the limitations of evaluation and management by telemedicine and the availability of in person appointments. The patient expressed understanding and agreed to proceed.  Patient: Alisha Miller   DOB: [birth year]   47 y.o. Female  MRN: [id number] Visit Date: 04/17/2023  Today's healthcare provider: Shirlee Latch, MD   No chief complaint on file.  Subjective    HPI   Discussed the use of AI scribe software for clinical note transcription with the patient, who gave verbal consent to proceed.  History of Present Illness   The patient, with a history of depression, alcohol use disorder, and fatty liver disease, presents for a medication refill. They report that they are in a 'good place' despite work-related stress and financial difficulties, which have led them to apply for a food stamp program. Their depression is well-managed on Zoloft 50mg  daily and Wellbutrin 300mg  daily. The naltrexone 50mg  daily has significantly reduced their cravings for alcohol. They have no new symptoms or concerns and plan to continue their current medication regimen.  The patient also has a diagnosis of fatty liver disease, identified on a recent ultrasound. They inquire about the need for any specific interventions for this condition.         Review of Systems      Objective    There were no vitals taken for this visit.      Physical Exam Constitutional:      General: She is not in acute distress.    Appearance: Normal appearance.  HENT:     Head: Normocephalic.  Pulmonary:     Effort: Pulmonary effort is normal. No respiratory  distress.  Neurological:     Mental Status: She is alert and oriented to person, place, and time. Mental status is at baseline.        Assessment & Plan     Problem List Items Addressed This Visit       Digestive   Fatty liver     Other   Recurrent major depressive disorder, in partial remission (HCC)   Relevant Medications   buPROPion (WELLBUTRIN XL) 300 MG 24 hr tablet   sertraline (ZOLOFT) 50 MG tablet   GAD (generalized anxiety disorder) - Primary   Relevant Medications   buPROPion (WELLBUTRIN XL) 300 MG 24 hr tablet   sertraline (ZOLOFT) 50 MG tablet        Alcohol Use Disorder (hesitant of diagnosis being used formally) Stable on Naltrexone 50mg  daily with significant reduction in cravings. Discussed the possibility of discontinuing Naltrexone in the future if progress continues. -Continue Naltrexone 50mg  daily. -Plan for reassessment at physical in August 2025.  Depression/Anxiety Stable on Zoloft 50mg  daily and Wellbutrin 300mg  daily. -Continue Zoloft 50mg  daily and Wellbutrin 300mg  daily.  Fatty Liver Disease Mild disease identified on ultrasound last month. Normal liver function tests suggest mild disease. Discussed the importance of lifestyle modifications to prevent progression. -Continue lifestyle modifications (diet and exercise). -Monitor liver function tests annually at physical.  General Health Maintenance -Schedule physical for August 2025. -[facility] [place]  Return in about 10 months (around 02/15/2024) for CPE.     I discussed the assessment and treatment plan with the patient. The patient was provided an opportunity to ask questions and all were answered. The patient agreed with the plan and demonstrated an understanding of the instructions.   The patient was advised to call back or seek an in-person evaluation if the symptoms worsen or if the condition fails to improve as  anticipated.  Shirlee Latch, MD [facility] [phone] (phone) [phone] (fax)  [facility]

## 2023-10-20 ENCOUNTER — Other Ambulatory Visit (HOSPITAL_COMMUNITY)
Admission: RE | Admit: 2023-10-20 | Discharge: 2023-10-20 | Disposition: A | Discharge status: Home / Self Care | Source: Ambulatory Visit | Attending: Obstetrics | Admitting: Obstetrics

## 2023-10-20 ENCOUNTER — Encounter: Payer: Self-pay | Admitting: Obstetrics

## 2023-10-20 ENCOUNTER — Ambulatory Visit: Payer: Self-pay | Admitting: Obstetrics

## 2023-10-20 VITALS — BP 113/74 | HR 73 | Ht 62.0 in | Wt 199.0 lb

## 2023-10-20 DIAGNOSIS — T8332XA Displacement of intrauterine contraceptive device, initial encounter: Secondary | ICD-10-CM

## 2023-10-20 DIAGNOSIS — N951 Menopausal and female climacteric states: Secondary | ICD-10-CM

## 2023-10-20 DIAGNOSIS — Z124 Encounter for screening for malignant neoplasm of cervix: Secondary | ICD-10-CM | POA: Diagnosis present

## 2023-10-20 DIAGNOSIS — Z30433 Encounter for removal and reinsertion of intrauterine contraceptive device: Secondary | ICD-10-CM

## 2023-10-20 MED ORDER — ESTRADIOL 0.1 MG/GM VA CREA: 0.2500 | TOPICAL_CREAM | Freq: Every day | VAGINAL | 12 refills | Status: AC

## 2023-10-20 MED ORDER — MISOPROSTOL 200 MCG PO TABS: ORAL_TABLET | ORAL | 0 refills | Status: DC

## 2023-10-20 MED ORDER — LORAZEPAM 1 MG PO TABS: 1.0000 mg | ORAL_TABLET | Freq: Once | ORAL | 0 refills | Status: DC | PRN

## 2023-10-20 NOTE — Progress Notes (Signed)
   ENCOUNTER FOR IUD INSERTION   Subjective  Alisha Miller is a 48 y.o. G1P0010 who presents today for IUD removal and insertion. She desires reversible long-term contraception. We have thoroughly reviewed the risks, benefits, and alternatives, and she has elected to proceed with Mirena removal and replacement. Her Mirena was placed in 2013. She has not had a period in years. She is experiencing some menopause symptoms (hot flashes, vaginal dryness) and is interested in checking her FSH level.  Objective BP 113/74   Pulse 73   Ht 5\' 2"  (1.575 m)   Wt 199 lb (90.3 kg)   BMI 36.40 kg/m    Pelvic exam: normal external genitalia, vulva, vagina, cervix, uterus and adnexa, VULVA: normal appearing vulva with no masses, tenderness or lesions, VAGINA: normal appearing vagina with normal color and discharge, no lesions, CERVIX: normal appearing cervix without discharge or lesions, cervical stenosis present. IUD strings not visible.  Procedure Note Consent was obtained prior to the procedure. A bimanual exam was performed to determine the position of the uterus. A sterile speculum was placed in the vagina, and the cervix was visualized. Pap smear was collected. An unsuccessful attempt was made to locate the IUD strings with a cytobrush. An attempt was made to pass the IUD thread finder through the os but was unsuccessful d/t cervical stenosis. A dilator was placed in the os and then removed, and the IUD thread finder was passed through easily. Several attempts were made to locate and grasp the strings without success. Kourtlynn tolerated this well.  Assessment Unsuccessful IUD removal Vaginal dryness  Plan -Pelvic US  ordered for IUD location.  -Pending US  results, will remove/replace in office with MD or in OR -Pre-procedure Ativan  and misoprostol  ordered -Vaginal estrogen cream ordered   Josue Nip, CNM

## 2023-10-21 ENCOUNTER — Encounter: Payer: Self-pay | Admitting: Obstetrics

## 2023-10-21 LAB — FOLLICLE STIMULATING HORMONE: FSH: 3.5 m[IU]/mL

## 2023-10-24 LAB — CYTOLOGY - PAP
Comment: NEGATIVE
Diagnosis: NEGATIVE
High risk HPV: NEGATIVE

## 2023-11-03 ENCOUNTER — Ambulatory Visit

## 2023-11-03 DIAGNOSIS — T8332XA Displacement of intrauterine contraceptive device, initial encounter: Secondary | ICD-10-CM | POA: Diagnosis not present

## 2023-11-05 NOTE — Progress Notes (Signed)
    GYNECOLOGY OFFICE PROCEDURE NOTE  SUBJECTIVE: Alisha Miller is a 48 y.o. G1P0010 here for mirena IUD removal and reinsertion. Current Mirena has been in place for since 2013 and has expired. She is amenorrheic with IUD. She was last seen by Missy, CNM, on 10/20/23 for removal, strings were missing and CNM unable to remove. Sent for US  to confirm placement, done 11/03/23 showing correctly placed. No GYN concerns. Last pap smear was on 10/20/23 and was normal.  OBJECTIVE:  BP 110/70   Ht 5\' 2"  (1.575 m)   Wt 200 lb (90.7 kg)   BMI 36.58 kg/m  Physical Exam Vitals and nursing note reviewed. Exam conducted with a chaperone present.  Constitutional:      Appearance: Normal appearance. She is obese.  HENT:     Head: Normocephalic and atraumatic.  Eyes:     Extraocular Movements: Extraocular movements intact.  Pulmonary:     Effort: Pulmonary effort is normal.  Genitourinary:    General: Normal vulva.     Vagina: Normal.     Cervix: Normal.     Uterus: Normal.   Neurological:     General: No focal deficit present.     Mental Status: She is alert.  Psychiatric:        Mood and Affect: Mood normal.    IUD Removal and Reinsertion  Patient identified, informed consent performed, consent signed.   Discussed risks of irregular bleeding, cramping, infection, malpositioning or misplacement of the IUD outside the uterus which may require further procedures. Also discussed >99% contraception efficacy, increased risk of ectopic pregnancy with failure of method.   Emphasized that this did not protect against STIs, condoms recommended during all sexual encounters.Advised to use backup contraception for one week as the risk of pregnancy is higher during the transition period of removing an IUD and replacing it with another one. Time out was performed. Speculum placed in the vagina. The strings of the IUD were not seen. The cervix was cleaned with Betadine x 2 and grasped anteriorly with a single  tooth tenaculum.  With ultrasound guidance, IUD visible at the fundus. IUD retractor first used, unable to grasp device. Alligator forceps then inserted and able to grasp IUD as seen on US , and removed in it's entirety, shown to the patient. Strings were wrapped vertically above on the T portion of the device. The new Mirena IUD insertion apparatus was used to sound the uterus to 8 cm;  the IUD was then placed per manufacturer's recommendations. Strings trimmed to 3 cm. Tenaculum was removed, good hemostasis noted. Patient tolerated procedure well.    ASSESSMENT/PLAN 48 y.o. G1P0010 here for IUD removal/reinsertion, Mirena removed with US  guidance, and replaced without complication. -Backup method for the next 7 days -Aftercare instructions reviewed, handout provided. Aware IUD protects against pregnancy only, not STIs.  -Recommend NSAID for cramping, pt declined Rx for IBU and will take at-home Naproxen. -Discussed how to perform string checks, if cannot find strings, RTC for exam. -Removal in 8 years, sooner if desires pregnancy.   Sofia Dunn, DO [facility]

## 2023-11-07 ENCOUNTER — Encounter: Payer: Self-pay | Admitting: Obstetrics

## 2023-11-07 ENCOUNTER — Ambulatory Visit: Admitting: Obstetrics

## 2023-11-07 VITALS — BP 110/70 | Ht 62.0 in | Wt 200.0 lb

## 2023-11-07 DIAGNOSIS — T8332XD Displacement of intrauterine contraceptive device, subsequent encounter: Secondary | ICD-10-CM

## 2023-11-07 DIAGNOSIS — Z30433 Encounter for removal and reinsertion of intrauterine contraceptive device: Secondary | ICD-10-CM | POA: Diagnosis not present

## 2023-11-07 MED ORDER — LEVONORGESTREL 20 MCG/DAY IU IUD
1.0000 | INTRAUTERINE_SYSTEM | Freq: Once | INTRAUTERINE | Status: AC
Start: 2023-11-07 — End: 2023-11-07
  Administered 2023-11-07: 1 via INTRAUTERINE

## 2023-11-07 NOTE — Patient Instructions (Signed)
   IUD AFTERCARE INSTRUCTIONS  Today you may go back to school or work after your visit. You must wait 24 hours after your IUD is put in before you can use tampons, take a bath, or have vaginal sex.  You may have more cramps or heavier bleeding with your periods, or spotting between your periods. This is normal. The cramping and bleeding can last for 3-6 months with the Mirena and [place] IUDs. After 6 months, the cramping and bleeding should get better. Many women will stop having periods after 1 or 2 years with the [place] and [place] IUDs. If you have the Paragard (copper) IUD, you may have more cramping and more bleeding with your periods as long as you have the IUD inside you.  Ibuprofen helps decrease the bleeding and cramping. You can take as many as 4 pills (800 mg) of Ibuprofen every 8 hours with food (each pill contains 200 mg).  Your IUD may come out by itself in the first three months. If you can feel the strings, the IUD is in the right place. If your IUD comes out, you can become pregnant immediately. If you are not sure how to check the strings, we can help you. Meanwhile, use condoms.  Your IUD does not protect against sexually transmitted infections including the HIV virus, genital warts (HPV), gonorrhea, chlamydia, trichomonas, syphilis and herpes. Condoms should be used to decrease the risk of sexually transmitted infections. If you think that you have been exposed to a sexually transmitted infection, please call the clinic. Most infections can be treated WITHOUT removing your IUD.  If you had your IUD placed for birth control, it is effective immediately if it was inserted within five days after the start of your period. If you have it inserted at any other time during your menstrual cycle, use another method of birth control, like condoms for at least 7 days.  Warning Signs Call the clinic if any of the following occurs:  You have fever (over 101F ) or chills.  The implant  comes out or you have concerns about its location.  You have a positive pregnancy test or suspect you might be pregnant.

## 2023-12-01 ENCOUNTER — Encounter: Payer: Self-pay | Admitting: Family Medicine

## 2023-12-10 ENCOUNTER — Other Ambulatory Visit: Payer: Self-pay | Admitting: Family Medicine

## 2023-12-11 NOTE — Telephone Encounter (Signed)
 Requested Prescriptions  Pending Prescriptions Disp Refills   valACYclovir  (VALTREX ) 500 MG tablet [Pharmacy Med Name: VALACYCLOVIR  HCL 500 MG TABLET] 12 tablet 0    Sig: TAKE 1 TABLET BY MOUTH TWICE A DAY     Antimicrobials:  Antiviral Agents - Anti-Herpetic Failed - 12/11/2023  5:48 PM      Failed - Valid encounter within last 12 months    Recent Outpatient Visits   None

## 2024-01-08 ENCOUNTER — Encounter: Payer: Self-pay | Admitting: Family Medicine

## 2024-01-08 MED ORDER — VALACYCLOVIR HCL 500 MG PO TABS: 500.0000 mg | ORAL_TABLET | Freq: Two times a day (BID) | ORAL | 0 refills | Status: DC

## 2024-01-20 MED ORDER — VALACYCLOVIR HCL 500 MG PO TABS: 500.0000 mg | ORAL_TABLET | Freq: Every day | ORAL | 1 refills | Status: AC

## 2024-04-27 ENCOUNTER — Telehealth: Payer: Self-pay

## 2024-04-27 DIAGNOSIS — E669 Obesity, unspecified: Secondary | ICD-10-CM

## 2024-04-27 DIAGNOSIS — Z0000 Encounter for general adult medical examination without abnormal findings: Secondary | ICD-10-CM

## 2024-04-27 DIAGNOSIS — K760 Fatty (change of) liver, not elsewhere classified: Secondary | ICD-10-CM

## 2024-04-27 DIAGNOSIS — Z833 Family history of diabetes mellitus: Secondary | ICD-10-CM

## 2024-04-27 NOTE — Telephone Encounter (Signed)
 Copied from CRM [id number]. Topic: Clinical - Lab/Test Results >> Apr 27, 2024 12:10 PM Alisha Miller wrote: Reason for CRM: Pt is having physical in December can you put in an order for labs to be done prior to appt

## 2024-04-29 NOTE — Telephone Encounter (Signed)
 Ok to order A1c, CMP, CBC, lipid - use diagnoses obesity from her problem list. Recommend that she get them at least 2 days before her appt with me. Get them fasting. Thanks

## 2024-04-30 NOTE — Addendum Note (Signed)
 Addended by: LILIAN SEVERO RAMAN on: 04/30/2024 11:08 AM   Modules accepted: Orders

## 2024-05-08 ENCOUNTER — Other Ambulatory Visit: Payer: Self-pay | Admitting: Family Medicine

## 2024-05-11 ENCOUNTER — Encounter: Payer: Self-pay | Admitting: Family Medicine

## 2024-05-11 ENCOUNTER — Other Ambulatory Visit: Payer: Self-pay | Admitting: Obstetrics

## 2024-05-12 ENCOUNTER — Other Ambulatory Visit: Payer: Self-pay

## 2024-05-12 DIAGNOSIS — F411 Generalized anxiety disorder: Secondary | ICD-10-CM

## 2024-05-20 ENCOUNTER — Other Ambulatory Visit: Payer: Self-pay | Admitting: Family Medicine

## 2024-05-20 DIAGNOSIS — F411 Generalized anxiety disorder: Secondary | ICD-10-CM

## 2024-05-20 NOTE — Telephone Encounter (Signed)
 Copied from CRM [id number]. Topic: Clinical - Medication Refill >> May 20, 2024  1:38 PM Tiffini S wrote: Medication: buPROPion  (WELLBUTRIN  XL) 300 MG 24 hr tablet  Has the patient contacted their pharmacy? Yes (Agent: If no, request that the patient contact the pharmacy for the refill. If patient does not wish to contact the pharmacy document the reason why and proceed with request.) (Agent: If yes, when and what did the pharmacy advise?)  This is the patient's preferred pharmacy:  [facility] [address] [place] - [address] [place] [address] Phone: [phone] Fax: [phone]   Is this the correct pharmacy for this prescription? Yes If no, delete pharmacy and type the correct one.   Has the prescription been filled recently? Yes  Is the patient out of the medication? Yes  Has the patient been seen for an appointment in the last year OR does the patient have an upcoming appointment? Yes  Can we respond through MyChart? Yes  Agent: Please be advised that Rx refills may take up to 3 business days. We ask that you follow-up with your pharmacy.

## 2024-05-21 ENCOUNTER — Other Ambulatory Visit: Payer: Self-pay | Admitting: Family Medicine

## 2024-05-21 DIAGNOSIS — F411 Generalized anxiety disorder: Secondary | ICD-10-CM

## 2024-05-21 MED ORDER — BUPROPION HCL ER (XL) 300 MG PO TB24: 300.0000 mg | ORAL_TABLET | Freq: Every day | ORAL | 0 refills | Status: DC

## 2024-05-22 NOTE — Telephone Encounter (Signed)
 Already refilled on 05/21/24 in a separate refill encounter.

## 2024-06-10 LAB — CBC WITH DIFFERENTIAL/PLATELET
Basophils Absolute: 0 x10E3/uL (ref 0.0–0.2)
Basos: 1 %
EOS (ABSOLUTE): 0.1 x10E3/uL (ref 0.0–0.4)
Eos: 2 %
Hematocrit: 43.3 % (ref 34.0–46.6)
Hemoglobin: 14.4 g/dL (ref 11.1–15.9)
Immature Grans (Abs): 0 x10E3/uL (ref 0.0–0.1)
Immature Granulocytes: 0 %
Lymphocytes Absolute: 1 x10E3/uL (ref 0.7–3.1)
Lymphs: 25 %
MCH: 30.4 pg (ref 26.6–33.0)
MCHC: 33.3 g/dL (ref 31.5–35.7)
MCV: 92 fL (ref 79–97)
Monocytes Absolute: 0.4 x10E3/uL (ref 0.1–0.9)
Monocytes: 10 %
Neutrophils Absolute: 2.6 x10E3/uL (ref 1.4–7.0)
Neutrophils: 62 %
Platelets: 299 x10E3/uL (ref 150–450)
RBC: 4.73 x10E6/uL (ref 3.77–5.28)
RDW: 12.6 % (ref 11.7–15.4)
WBC: 4.1 x10E3/uL (ref 3.4–10.8)

## 2024-06-10 LAB — LIPID PANEL WITH LDL/HDL RATIO
Cholesterol, Total: 206 mg/dL — ABNORMAL HIGH (ref 100–199)
HDL: 57 mg/dL (ref >39)
LDL Chol Calc (NIH): 132 mg/dL — ABNORMAL HIGH (ref 0–99)
LDL/HDL Ratio: 2.3 ratio (ref 0.0–3.2)
Triglycerides: 96 mg/dL (ref 0–149)
VLDL Cholesterol Cal: 17 mg/dL (ref 5–40)

## 2024-06-10 LAB — COMPREHENSIVE METABOLIC PANEL WITH GFR
ALT: 26 IU/L (ref 0–32)
AST: 20 IU/L (ref 0–40)
Albumin: 4.1 g/dL (ref 3.9–4.9)
Alkaline Phosphatase: 61 IU/L (ref 41–116)
BUN/Creatinine Ratio: 10 (ref 9–23)
BUN: 7 mg/dL (ref 6–24)
Bilirubin Total: 0.4 mg/dL (ref 0.0–1.2)
CO2: 23 mmol/L (ref 20–29)
Calcium: 9 mg/dL (ref 8.7–10.2)
Chloride: 99 mmol/L (ref 96–106)
Creatinine, Ser: 0.68 mg/dL (ref 0.57–1.00)
Globulin, Total: 2.3 g/dL (ref 1.5–4.5)
Glucose: 102 mg/dL — ABNORMAL HIGH (ref 70–99)
Potassium: 4.3 mmol/L (ref 3.5–5.2)
Sodium: 136 mmol/L (ref 134–144)
Total Protein: 6.4 g/dL (ref 6.0–8.5)
eGFR: 107 mL/min/1.73 (ref >59)

## 2024-06-10 LAB — HEMOGLOBIN A1C
Est. average glucose Bld gHb Est-mCnc: 114 mg/dL
Hgb A1c MFr Bld: 5.6 % (ref 4.8–5.6)

## 2024-06-13 ENCOUNTER — Ambulatory Visit: Payer: Self-pay | Admitting: Physician Assistant

## 2024-06-15 ENCOUNTER — Encounter: Payer: Self-pay | Admitting: Family Medicine

## 2024-06-15 ENCOUNTER — Ambulatory Visit (INDEPENDENT_AMBULATORY_CARE_PROVIDER_SITE_OTHER): Admitting: Family Medicine

## 2024-06-15 ENCOUNTER — Encounter: Admitting: Family Medicine

## 2024-06-15 VITALS — BP 128/84 | HR 72 | Ht 62.0 in | Wt 205.4 lb

## 2024-06-15 DIAGNOSIS — F411 Generalized anxiety disorder: Secondary | ICD-10-CM

## 2024-06-15 DIAGNOSIS — F1020 Alcohol dependence, uncomplicated: Secondary | ICD-10-CM | POA: Insufficient documentation

## 2024-06-15 DIAGNOSIS — F331 Major depressive disorder, recurrent, moderate: Secondary | ICD-10-CM

## 2024-06-15 DIAGNOSIS — E785 Hyperlipidemia, unspecified: Secondary | ICD-10-CM | POA: Insufficient documentation

## 2024-06-15 DIAGNOSIS — G4733 Obstructive sleep apnea (adult) (pediatric): Secondary | ICD-10-CM

## 2024-06-15 DIAGNOSIS — N951 Menopausal and female climacteric states: Secondary | ICD-10-CM | POA: Insufficient documentation

## 2024-06-15 DIAGNOSIS — E782 Mixed hyperlipidemia: Secondary | ICD-10-CM

## 2024-06-15 DIAGNOSIS — Z1231 Encounter for screening mammogram for malignant neoplasm of breast: Secondary | ICD-10-CM

## 2024-06-15 DIAGNOSIS — I10 Essential (primary) hypertension: Secondary | ICD-10-CM | POA: Insufficient documentation

## 2024-06-15 MED ORDER — ESTRADIOL 0.025 MG/24HR TD PTWK: 0.0250 mg | MEDICATED_PATCH | TRANSDERMAL | 12 refills | Status: AC

## 2024-06-15 NOTE — Assessment & Plan Note (Signed)
 Anxiety levels increased due to recent family loss and upcoming holidays. Currently seeing a counselor regularly and taking sertraline  and Wellbutrin . Has not used alprazolam  despite considering it during high anxiety periods. - Continue counseling sessions - Continue sertraline  and Wellbutrin 

## 2024-06-15 NOTE — Progress Notes (Signed)
 Complete physical exam   Patient: Alisha Miller   DOB: January 11, 1976   48 y.o. Female  MRN: [id number] Visit Date: 06/15/2024  Today's healthcare provider: Jon Eva, MD   Chief Complaint  Patient presents with   Annual Exam    Last completed 02/14/23 Diet -  not eating enough, working with nourish, trying to do more low sodium and low sugar Exercise - none Feeling - well but having symptoms or perimenopause Sleeping - well Concerns -  husband says she is still snoring with use of cpap, dentist recommends she follows up with this, AVG BP in 90 days 142/90 but running much higher before thanksgiving   Subjective    Alisha Miller is a 48 y.o. female who presents today for a complete physical exam.    Discussed the use of AI scribe software for clinical note transcription with the patient, who gave verbal consent to proceed.  History of Present Illness   Alisha Miller is a 48 year old female who presents for an annual physical exam with concerns about blood pressure and sleep apnea.  She notes frequent elevated home blood pressure readings, which she checks in the morning, at night, and sometimes before caffeine. She links this to a 10-pound weight gain since placement of her current IUD. She is working with a nutritionist to reduce sugar and sodium and is limiting caffeine.  Her husband occasionally notices snoring, which is less intense than in the past. She uses an oral appliance set at 5 mm. Her dentist recommended a repeat sleep study before adjusting the device. She feels her sleep quality is better and she is dreaming regularly but remains concerned about intermittent snoring.  She treats anxiety and depression with sertraline  and bupropion  and sees a counselor more often around the holidays. She has alprazolam  prescribed but has not used it recently, though she has considered it during high stress. She takes naltrexone  for alcohol use and feels her drinking has  improved.  She has perimenopausal symptoms including hot flashes and mood changes. She has had an IUD in place for 12 years and notes irregular, light spotting rather than regular periods.       Last depression screening scores    06/15/2024   11:06 AM 02/14/2023    8:26 AM 10/07/2022    4:00 PM  PHQ 2/9 Scores  PHQ - 2 Score 3 3 1   PHQ- 9 Score 15 8  7       Data saved with a previous flowsheet row definition   Last fall risk screening    02/14/2023    8:26 AM  Fall Risk   Falls in the past year? 0  Number falls in past yr: 0  Injury with Fall? 0   Risk for fall due to : No Fall Risks  Follow up Falls evaluation completed     Data saved with a previous flowsheet row definition        Medications: Show/hide medication list[1]  Review of Systems    Objective    BP 128/84 Comment: home reading @ 8:35 am  Pulse 72   Ht 5' 2 (1.575 m)   Wt 205 lb 6.4 oz (93.2 kg)   SpO2 100%   BMI 37.57 kg/m    Physical Exam Vitals reviewed.  Constitutional:      General: She is not in acute distress.    Appearance: Normal appearance. She is well-developed. She is not diaphoretic.  HENT:  Head: Normocephalic and atraumatic.     Right Ear: Tympanic membrane, ear canal and external ear normal.     Left Ear: Tympanic membrane, ear canal and external ear normal.     Nose: Nose normal.     Mouth/Throat:     Mouth: Mucous membranes are moist.     Pharynx: Oropharynx is clear. No oropharyngeal exudate.  Eyes:     General: No scleral icterus.    Conjunctiva/sclera: Conjunctivae normal.     Pupils: Pupils are equal, round, and reactive to light.  Neck:     Thyroid: No thyromegaly.  Cardiovascular:     Rate and Rhythm: Normal rate and regular rhythm.     Heart sounds: Normal heart sounds. No murmur heard. Pulmonary:     Effort: Pulmonary effort is normal. No respiratory distress.     Breath sounds: Normal breath sounds. No wheezing or rales.  Abdominal:     General:  There is no distension.     Palpations: Abdomen is soft.     Tenderness: There is no abdominal tenderness.  Musculoskeletal:        General: No deformity.     Cervical back: Neck supple.     Right lower leg: No edema.     Left lower leg: No edema.  Lymphadenopathy:     Cervical: No cervical adenopathy.  Skin:    General: Skin is warm and dry.     Findings: No rash.  Neurological:     Mental Status: She is alert and oriented to person, place, and time. Mental status is at baseline.     Gait: Gait normal.  Psychiatric:        Mood and Affect: Mood normal.        Behavior: Behavior normal.        Thought Content: Thought content normal.      The 10-year ASCVD risk score (Arnett DK, et al., 2019) is: 1%   No results found for any visits on 06/15/24.  Assessment & Plan    Routine Health Maintenance and Physical Exam  Exercise Activities and Dietary recommendations  Goals   None     Immunization History  Administered Date(s) Administered   Moderna Sars-Covid-2 Vaccination 03/15/2020, 04/12/2020   Td 03/22/1998, 02/02/2021   Tdap 09/27/2010    Health Maintenance  Topic Date Due   Hepatitis B Vaccines 19-59 Average Risk (1 of 3 - 19+ 3-dose series) Never done   COVID-19 Vaccine (3 - 2025-26 season) 03/01/2024   Mammogram  03/17/2024   Influenza Vaccine  09/28/2024 (Originally 01/30/2024)   Cervical Cancer Screening (HPV/Pap Cotest)  10/19/2028   DTaP/Tdap/Td (4 - Td or Tdap) 02/03/2031   Colonoscopy  04/19/2032   Hepatitis C Screening  Completed   HIV Screening  Completed   Pneumococcal Vaccine  Aged Out   HPV VACCINES  Aged Out   Meningococcal B Vaccine  Aged Out    Discussed health benefits of physical activity, and encouraged her to engage in regular exercise appropriate for her age and condition.  Problem List Items Addressed This Visit       Cardiovascular and Mediastinum   Primary hypertension   Blood pressure readings at home show trend of elevated  diastolic pressures, often over 90 mmHg. Prefers to avoid medication at this time. Working with a nutritionist to implement dietary changes, including reducing sodium and sugar intake. Goal is to achieve blood pressure in the 120s/70s-80s range for long-term cardiovascular health. - Continue dietary modifications with  nutritionist - Monitor blood pressure at home - Will reassess blood pressure in 6 months        Respiratory   OSA (obstructive sleep apnea)   Reports improvement in sleep quality with regular dreaming, but husband notes occasional snoring. Current oral appliance is at maximum setting of 5 mm, with concerns about increasing to 6 mm due to jaw discomfort. No reported apneic episodes by husband. - Ordered home sleep study with oral appliance in place to reassess efficacy - Documented use of oral appliance and reassessment plan      Relevant Orders   Ambulatory referral to Sleep Studies     Other   Recurrent major depressive disorder, in partial remission   Depression symptoms managed with current medication regimen and counseling. Recent family loss has contributed to emotional distress. - Continue current medication regimen - Continue counseling sessions      GAD (generalized anxiety disorder)   Anxiety levels increased due to recent family loss and upcoming holidays. Currently seeing a counselor regularly and taking sertraline  and Wellbutrin . Has not used alprazolam  despite considering it during high anxiety periods. - Continue counseling sessions - Continue sertraline  and Wellbutrin       Alcohol dependence (HCC)   Reports reduction in alcohol consumption, correlating with improved blood pressure readings. Using naltrexone  to aid in moderation. - Continue naltrexone  - Monitor alcohol consumption      Perimenopausal   Experiencing symptoms consistent with perimenopause, including mood swings and hot flashes. Discussed potential use of estrogen patches to alleviate  symptoms. IUD provides necessary progesterone to protect the uterus. Discussed potential for increased bleeding with estrogen use. No significant increase in breast cancer risk with hormone replacement therapy. - Prescribed Climera estrogen patch, starting at 0.025 mg, to be changed weekly - Monitor for symptom improvement and side effects      Hyperlipidemia   Cholesterol levels slightly elevated. Ten-year risk of heart disease and stroke calculated at 1%. Prefers lifestyle modifications over medication. - Continue dietary and lifestyle modifications      Other Visit Diagnoses       Encounter for screening mammogram for malignant neoplasm of breast    -  Primary   Relevant Orders   MM 3D SCREENING MAMMOGRAM BILATERAL BREAST           General Health Maintenance Routine health maintenance discussed, including mammogram and colonoscopy schedules. Pap smear and tetanus vaccination are up to date. - Ordered mammogram - Continue routine health maintenance schedule       Return in about 6 months (around 12/14/2024) for chronic disease f/u.     Jon Eva, MD  [facility] [phone] (phone) [phone] (fax)  [facility]     [1]  Outpatient Medications Prior to Visit  Medication Sig   albuterol  (VENTOLIN  HFA) 108 (90 Base) MCG/ACT inhaler Inhale 2 puffs into the lungs every 6 (six) hours as needed for wheezing or shortness of breath.   buPROPion  (WELLBUTRIN  XL) 300 MG 24 hr tablet Take 1 tablet (300 mg total) by mouth daily.   estradiol  (ESTRACE ) 0.1 MG/GM vaginal cream Place 0.25 Applicatorfuls vaginally at bedtime.   fluticasone  (FLONASE ) 50 MCG/ACT nasal spray Place 2 sprays into both nostrils daily.   levonorgestrel  (MIRENA ) 20 MCG/24HR IUD by Intrauterine route.   loratadine (CLARITIN) 10 MG tablet Take 10 mg by mouth daily.   minocycline (MINOCIN) 50 MG capsule    naltrexone  (DEPADE) 50 MG tablet Take 1 tablet (50 mg  total)  by mouth daily.   sertraline  (ZOLOFT ) 50 MG tablet TAKE 1 TABLET BY MOUTH EVERY DAY   SULFACLEANSE 8/4 8-4 % SUSP    valACYclovir  (VALTREX ) 500 MG tablet Take 1 tablet (500 mg total) by mouth daily.   No facility-administered medications prior to visit.

## 2024-06-15 NOTE — Assessment & Plan Note (Signed)
 Experiencing symptoms consistent with perimenopause, including mood swings and hot flashes. Discussed potential use of estrogen patches to alleviate symptoms. IUD provides necessary progesterone to protect the uterus. Discussed potential for increased bleeding with estrogen use. No significant increase in breast cancer risk with hormone replacement therapy. - Prescribed Climera estrogen patch, starting at 0.025 mg, to be changed weekly - Monitor for symptom improvement and side effects

## 2024-06-15 NOTE — Assessment & Plan Note (Signed)
 Reports improvement in sleep quality with regular dreaming, but husband notes occasional snoring. Current oral appliance is at maximum setting of 5 mm, with concerns about increasing to 6 mm due to jaw discomfort. No reported apneic episodes by husband. - Ordered home sleep study with oral appliance in place to reassess efficacy - Documented use of oral appliance and reassessment plan

## 2024-06-15 NOTE — Assessment & Plan Note (Signed)
 Depression symptoms managed with current medication regimen and counseling. Recent family loss has contributed to emotional distress. - Continue current medication regimen - Continue counseling sessions

## 2024-06-15 NOTE — Assessment & Plan Note (Signed)
 Cholesterol levels slightly elevated. Ten-year risk of heart disease and stroke calculated at 1%. Prefers lifestyle modifications over medication. - Continue dietary and lifestyle modifications

## 2024-06-15 NOTE — Assessment & Plan Note (Signed)
 Reports reduction in alcohol consumption, correlating with improved blood pressure readings. Using naltrexone  to aid in moderation. - Continue naltrexone  - Monitor alcohol consumption

## 2024-06-15 NOTE — Patient Instructions (Signed)

## 2024-06-15 NOTE — Assessment & Plan Note (Signed)
 Blood pressure readings at home show trend of elevated diastolic pressures, often over 90 mmHg. Prefers to avoid medication at this time. Working with a nutritionist to implement dietary changes, including reducing sodium and sugar intake. Goal is to achieve blood pressure in the 120s/70s-80s range for long-term cardiovascular health. - Continue dietary modifications with nutritionist - Monitor blood pressure at home - Will reassess blood pressure in 6 months

## 2024-06-16 ENCOUNTER — Other Ambulatory Visit: Payer: Self-pay | Admitting: Family Medicine

## 2024-06-28 ENCOUNTER — Ambulatory Visit: Payer: Self-pay | Admitting: Family Medicine

## 2024-07-13 ENCOUNTER — Encounter: Payer: Self-pay | Admitting: Family Medicine

## 2024-07-13 ENCOUNTER — Other Ambulatory Visit: Payer: Self-pay | Admitting: Family Medicine

## 2024-07-13 DIAGNOSIS — F411 Generalized anxiety disorder: Secondary | ICD-10-CM

## 2024-07-13 MED ORDER — BUPROPION HCL ER (XL) 300 MG PO TB24: 300.0000 mg | ORAL_TABLET | Freq: Every day | ORAL | 0 refills | Status: AC

## 2024-07-16 ENCOUNTER — Ambulatory Visit
Admission: RE | Admit: 2024-07-16 | Discharge: 2024-07-16 | Disposition: A | Discharge status: Home / Self Care | Source: Ambulatory Visit | Attending: Family Medicine | Admitting: Family Medicine

## 2024-07-16 ENCOUNTER — Other Ambulatory Visit: Payer: Self-pay | Admitting: Family Medicine

## 2024-07-16 DIAGNOSIS — Z1231 Encounter for screening mammogram for malignant neoplasm of breast: Secondary | ICD-10-CM | POA: Diagnosis present

## 2024-07-20 ENCOUNTER — Ambulatory Visit: Payer: Self-pay | Admitting: Family Medicine

## 2024-12-14 ENCOUNTER — Ambulatory Visit: Admitting: Family Medicine
